# Patient Record
Sex: Female | Born: 1955 | Race: Black or African American | Hispanic: No | Smoking: Never smoker
Health system: Southern US, Community
[De-identification: ages and names within clinical notes are randomized; demographics above are authoritative.]

## PROBLEM LIST (undated history)

## (undated) DIAGNOSIS — Z803 Family history of malignant neoplasm of breast: Secondary | ICD-10-CM

## (undated) HISTORY — DX: Family history of malignant neoplasm of breast: Z80.3

---

## 2020-10-30 ENCOUNTER — Other Ambulatory Visit: Payer: Self-pay | Admitting: Surgery

## 2020-10-30 DIAGNOSIS — C50911 Malignant neoplasm of unspecified site of right female breast: Secondary | ICD-10-CM

## 2020-11-01 ENCOUNTER — Other Ambulatory Visit: Payer: Self-pay | Admitting: Surgery

## 2020-11-01 DIAGNOSIS — Z77018 Contact with and (suspected) exposure to other hazardous metals: Secondary | ICD-10-CM

## 2020-11-05 ENCOUNTER — Ambulatory Visit
Admission: RE | Admit: 2020-11-05 | Discharge: 2020-11-05 | Disposition: A | Discharge status: Home / Self Care | Payer: Medicare Other | Source: Ambulatory Visit | Attending: Surgery | Admitting: Surgery

## 2020-11-05 ENCOUNTER — Other Ambulatory Visit: Payer: Self-pay | Admitting: Surgery

## 2020-11-05 ENCOUNTER — Other Ambulatory Visit: Payer: Self-pay

## 2020-11-05 DIAGNOSIS — Z77018 Contact with and (suspected) exposure to other hazardous metals: Secondary | ICD-10-CM

## 2020-11-05 DIAGNOSIS — C50911 Malignant neoplasm of unspecified site of right female breast: Secondary | ICD-10-CM

## 2020-11-05 MED ORDER — GADOBUTROL 1 MMOL/ML IV SOLN
9.0000 mL | Freq: Once | INTRAVENOUS | Status: AC | PRN
  Administered 2020-11-05: 9 mL via INTRAVENOUS

## 2020-11-07 ENCOUNTER — Other Ambulatory Visit: Payer: Self-pay | Admitting: Surgery

## 2020-11-07 DIAGNOSIS — C50919 Malignant neoplasm of unspecified site of unspecified female breast: Secondary | ICD-10-CM

## 2020-11-19 ENCOUNTER — Ambulatory Visit
Admission: RE | Admit: 2020-11-19 | Discharge: 2020-11-19 | Disposition: A | Discharge status: Home / Self Care | Payer: Medicare Other | Source: Ambulatory Visit | Attending: Surgery | Admitting: Surgery

## 2020-11-19 ENCOUNTER — Other Ambulatory Visit: Payer: Self-pay

## 2020-11-19 DIAGNOSIS — C50919 Malignant neoplasm of unspecified site of unspecified female breast: Secondary | ICD-10-CM

## 2020-11-20 ENCOUNTER — Other Ambulatory Visit: Payer: Self-pay | Admitting: Surgery

## 2020-11-20 DIAGNOSIS — N63 Unspecified lump in unspecified breast: Secondary | ICD-10-CM

## 2020-11-22 ENCOUNTER — Other Ambulatory Visit: Payer: Self-pay

## 2020-11-22 ENCOUNTER — Ambulatory Visit
Admission: RE | Admit: 2020-11-22 | Discharge: 2020-11-22 | Disposition: A | Discharge status: Home / Self Care | Payer: Medicare Other | Source: Ambulatory Visit | Attending: Surgery | Admitting: Surgery

## 2020-11-22 DIAGNOSIS — N63 Unspecified lump in unspecified breast: Secondary | ICD-10-CM

## 2021-01-01 ENCOUNTER — Encounter (HOSPITAL_COMMUNITY): Payer: Self-pay

## 2021-01-03 ENCOUNTER — Inpatient Hospital Stay (HOSPITAL_COMMUNITY): Payer: Medicare Other | Admitting: Hematology

## 2021-01-03 DIAGNOSIS — C50511 Malignant neoplasm of lower-outer quadrant of right female breast: Secondary | ICD-10-CM | POA: Insufficient documentation

## 2021-01-07 ENCOUNTER — Inpatient Hospital Stay (HOSPITAL_COMMUNITY): Payer: Medicare Other | Attending: Hematology | Admitting: Hematology

## 2021-01-07 ENCOUNTER — Other Ambulatory Visit: Payer: Self-pay

## 2021-01-07 ENCOUNTER — Encounter (HOSPITAL_COMMUNITY): Payer: Self-pay | Admitting: Hematology

## 2021-01-07 ENCOUNTER — Inpatient Hospital Stay (HOSPITAL_COMMUNITY): Payer: Medicare Other

## 2021-01-07 VITALS — BP 133/93 | HR 96 | Temp 97.1°F | Resp 18 | Wt 185.9 lb

## 2021-01-07 DIAGNOSIS — C50411 Malignant neoplasm of upper-outer quadrant of right female breast: Secondary | ICD-10-CM

## 2021-01-07 DIAGNOSIS — Z170 Estrogen receptor positive status [ER+]: Secondary | ICD-10-CM

## 2021-01-07 DIAGNOSIS — Z803 Family history of malignant neoplasm of breast: Secondary | ICD-10-CM

## 2021-01-07 LAB — CBC WITH DIFFERENTIAL/PLATELET
Abs Immature Granulocytes: 0.02 10*3/uL (ref 0.00–0.07)
Basophils Absolute: 0 10*3/uL (ref 0.0–0.1)
Basophils Relative: 1 %
Eosinophils Absolute: 0.1 10*3/uL (ref 0.0–0.5)
Eosinophils Relative: 2 %
HCT: 36.7 % (ref 36.0–46.0)
Hemoglobin: 11.4 g/dL — ABNORMAL LOW (ref 12.0–15.0)
Immature Granulocytes: 0 %
Lymphocytes Relative: 30 %
Lymphs Abs: 2.6 10*3/uL (ref 0.7–4.0)
MCH: 29.8 pg (ref 26.0–34.0)
MCHC: 31.1 g/dL (ref 30.0–36.0)
MCV: 95.8 fL (ref 80.0–100.0)
Monocytes Absolute: 0.7 10*3/uL (ref 0.1–1.0)
Monocytes Relative: 8 %
Neutro Abs: 5.2 10*3/uL (ref 1.7–7.7)
Neutrophils Relative %: 59 %
Platelets: 286 10*3/uL (ref 150–400)
RBC: 3.83 MIL/uL — ABNORMAL LOW (ref 3.87–5.11)
RDW: 14.1 % (ref 11.5–15.5)
WBC: 8.6 10*3/uL (ref 4.0–10.5)
nRBC: 0 % (ref 0.0–0.2)

## 2021-01-07 LAB — COMPREHENSIVE METABOLIC PANEL
ALT: 13 U/L (ref 0–44)
AST: 21 U/L (ref 15–41)
Albumin: 3.8 g/dL (ref 3.5–5.0)
Alkaline Phosphatase: 109 U/L (ref 38–126)
Anion gap: 8 (ref 5–15)
BUN: 14 mg/dL (ref 8–23)
CO2: 26 mmol/L (ref 22–32)
Calcium: 9.1 mg/dL (ref 8.9–10.3)
Chloride: 100 mmol/L (ref 98–111)
Creatinine, Ser: 0.9 mg/dL (ref 0.44–1.00)
GFR, Estimated: >60 mL/min (ref >60)
Glucose, Bld: 80 mg/dL (ref 70–99)
Potassium: 3.8 mmol/L (ref 3.5–5.1)
Sodium: 134 mmol/L — ABNORMAL LOW (ref 135–145)
Total Bilirubin: 0.5 mg/dL (ref 0.3–1.2)
Total Protein: 7.4 g/dL (ref 6.5–8.1)

## 2021-01-07 LAB — VITAMIN D 25 HYDROXY (VIT D DEFICIENCY, FRACTURES): Vit D, 25-Hydroxy: 61.06 ng/mL (ref 30–100)

## 2021-01-07 MED ORDER — ANASTROZOLE 1 MG PO TABS
1.0000 mg | ORAL_TABLET | Freq: Every day | ORAL | 6 refills | Status: DC
Start: 2021-01-07 — End: 2021-08-07

## 2021-01-07 NOTE — Patient Instructions (Signed)
[place] at [facility] Discharge Instructions  You were seen and examined today by Dr. Delton Coombes. Dr. Delton Coombes is a medical oncologist, meaning he specializes in the management of cancer diagnoses with medications. Dr. Delton Coombes discussed your past medical history, family history of cancer and the events that led to you being here today.  You have been diagnosed with Stage I breast cancer. Your cancer was positive for Estrogen Receptors, Progesterone Receptors and HER-2. Your lymph nodes were negative. This is great news. Your cancer was extremely small at only 0.5 cm, but it is slightly more aggressive because it is HER2 positive. There is not strong data to show if you would benefit from chemotherapy due to the extremely small size of your breast cancer. It would be absolutely reasonable for you continue with close monitoring. Our office will arrange for a referral to Radiation Oncology and for a follow-up in 4 months. Because your cancer is estrogen positive, you will need an antiestrogen pill for 5 years. The most common side effects of antiestrogen medications are menopause-like symptoms. Please take calcium and vitamin d each day.  Dr. Delton Coombes has recommended genetic counseling to see if there is any genetic link to your cancer. This can be done with a simple blood draw and a meeting with the genetic counselor. Dr. Delton Coombes has also recommended  Follow-up as scheduled.  Thank you for choosing [place] at [facility] to provide your oncology and hematology care.  To afford each patient quality time with our provider, please arrive at least 15 minutes before your scheduled appointment time.   If you have a lab appointment with the [facility] please come in thru the Main Entrance and check in at the main information desk.  You need to re-schedule your appointment should you arrive 10 or more minutes late.  We strive to give you quality  time with our providers, and arriving late affects you and other patients whose appointments are after yours.  Also, if you no show three or more times for appointments you may be dismissed from the clinic at the providers discretion.     Again, thank you for choosing [facility].  Our hope is that these requests will decrease the amount of time that you wait before being seen by our physicians.       _____________________________________________________________  Should you have questions after your visit to [facility], please contact our office at [phone] and follow the prompts.  Our office hours are 8:00 a.m. and 4:30 p.m. Monday - Friday.  Please note that voicemails left after 4:00 p.m. may not be returned until the following business day.  We are closed weekends and major holidays.  You do have access to a nurse 24-7, just call the main number to the clinic [phone] and do not press any options, hold on the line and a nurse will answer the phone.    For prescription refill requests, have your pharmacy contact our office and allow 72 hours.    Due to Covid, you will need to wear a mask upon entering the hospital. If you do not have a mask, a mask will be given to you at the Main Entrance upon arrival. For doctor visits, patients may have 1 support person age 53 or older with them. For treatment visits, patients can not have anyone with them due to social distancing guidelines and our immunocompromised population.

## 2021-01-07 NOTE — Progress Notes (Signed)
[place] [address] [place] [address]   Patient Care Team: Pcp, No as PCP - General [name], RN as Oncology Nurse Navigator (Oncology)  CHIEF COMPLAINTS/PURPOSE OF CONSULTATION:  Newly diagnosed invasive ductal carcinoma of right breast  HISTORY OF PRESENTING ILLNESS:  Jill Wheeler 65 y.o. female is here because of recent diagnosis of invasive ductal carcinoma of right breast, at the request of Dr. Karlyn Agee from [facility].  Today she is accompanied by a family member, Jill Wheeler, and she reports feeling well. She denies having any previous breast biopsies. She never felt the lump and it was discovered on mammogram. Her breast is still sore after the biopsy but is healing. She denies having any leg swelling. She took vitamin D once a week and now is taking daily vitamin D.  Her daughter was diagnosed with breast cancer at age of 82; 2 of her brother's nieces were also diagnosed with breast cancer in their 93's; her MGM had breast cancer; her paternal cousin had breast cancer. 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 She smoked and quit many years ago. She lives at home alone and is able to do all of her chores and ADL's.  She prefers to not have to go through chemo if she can skip it.  I reviewed her records extensively and collaborated the history with the patient.  SUMMARY OF ONCOLOGIC HISTORY: Oncology History   No history exists.    In terms of breast cancer risk profile:  She menarched at early age of 22 and went to menopause at age mid 70's.  She had 3 pregnancies, her first child was born at age 83.  She has received birth control pills for approximately 10 years.  She was never exposed to fertility medications or hormone replacement therapy.  She has positive family history of Breast/GYN/GI cancer.  MEDICAL HISTORY:  No past medical history on file.  SURGICAL HISTORY: ** The histories are not reviewed yet.  Please review them in the "History" navigator section and refresh this [place].  SOCIAL HISTORY: Social History   Socioeconomic History  . Marital status: Divorced    Spouse name: Not on file  . Number of children: Not on file  . Years of education: Not on file  . Highest education level: Not on file  Occupational History  . Not on file  Tobacco Use  . Smoking status: Never Smoker  . Smokeless tobacco: Never Used  Substance and Sexual Activity  . Alcohol use: Not Currently  . Drug use: Never  . Sexual activity: Never  Other Topics Concern  . Not on file  Social History Narrative  . Not on file   Social Determinants of Health   Financial Resource Strain: Not on file  Food Insecurity: Not on file  Transportation Needs: Not on file  Physical Activity: Not on file  Stress: Not on file  Social Connections: Not on file  Intimate Partner Violence: Not At Risk  . Fear of Current or Ex-Partner: No  . Emotionally Abused: No  . Physically Abused: No  . Sexually Abused: No    FAMILY HISTORY: No family history on file.  ALLERGIES:  is allergic to lisinopril and penicillins.  MEDICATIONS:  Current Outpatient Medications  Medication Sig Dispense Refill  . anastrozole (ARIMIDEX) 1 MG tablet Take 1 tablet (1 mg total) by mouth daily. 30 tablet 6  . aspirin 81 MG EC tablet Take 81  mg by mouth daily.    Marland Kitchen atorvastatin (LIPITOR) 40 MG tablet 1 tab(s)    . cetirizine (ZYRTEC) 10 MG tablet 1 tab(s)    . losartan-hydrochlorothiazide (HYZAAR) 100-25 MG tablet SMARTSIG:0.5 Tablet(s) By Mouth 1 to 2 Times Daily    . Vitamin D, Ergocalciferol, (DRISDOL) 1.25 MG (50000 UNIT) CAPS capsule 1 capsule    . docusate sodium (COLACE) 100 MG capsule Take 100 mg by mouth 2 (two) times daily. (Patient not taking: Reported on 01/07/2021)    . HYDROcodone-acetaminophen (NORCO/VICODIN) 5-325 MG tablet Take 1 tablet by mouth every 4 (four) hours as needed. (Patient not taking: Reported on 01/07/2021)     . LORazepam (ATIVAN) 0.5 MG tablet 1/2 tab (Patient not taking: Reported on 01/07/2021)    . ondansetron (ZOFRAN) 4 MG tablet Take by mouth. (Patient not taking: Reported on 01/07/2021)    . pravastatin (PRAVACHOL) 40 MG tablet Take 1 tablet by mouth daily.    . traMADol (ULTRAM) 50 MG tablet Take 50 mg by mouth 4 (four) times daily as needed. (Patient not taking: Reported on 01/07/2021)     No current facility-administered medications for this visit.    REVIEW OF SYSTEMS:   Review of Systems  Constitutional: Positive for fatigue (50%). Negative for appetite change.  Cardiovascular: Negative for leg swelling.  Musculoskeletal: Positive for myalgias (overall soreness).  All other systems reviewed and are negative.   PHYSICAL EXAMINATION: ECOG PERFORMANCE STATUS: 0 - Asymptomatic  Vitals:   01/07/21 1321  BP: (!) 133/93  Pulse: 96  Resp: 18  Temp: (!) 97.1 F (36.2 C)  SpO2: 98%   Filed Weights   01/07/21 1321  Weight: 185 lb 14.4 oz (84.3 kg)   Physical Exam Vitals reviewed.  Constitutional:      Appearance: Normal appearance.  Cardiovascular:     Rate and Rhythm: Normal rate and regular rhythm.     Pulses: Normal pulses.     Heart sounds: Normal heart sounds.  Pulmonary:     Effort: Pulmonary effort is normal.     Breath sounds: Normal breath sounds.  Chest:  Breasts:     Right: Skin change (lumpectomy scar healing; hematoma in outer upper & lower quadrant) present. No inverted nipple or nipple discharge.     Left: No swelling, bleeding, inverted nipple, mass, nipple discharge, skin change or tenderness.    Abdominal:     Palpations: Abdomen is soft. There is no mass.     Tenderness: There is no abdominal tenderness.     Hernia: No hernia is present.  Musculoskeletal:     Right lower leg: No edema.     Left lower leg: No edema.  Neurological:     General: No focal deficit present.     Mental Status: She is alert and oriented to person, place, and time.   Psychiatric:        Mood and Affect: Mood normal.        Behavior: Behavior normal.      LABORATORY DATA:  I have reviewed the data as listed No results found for this or any previous visit (from the past 2160 hour(s)).  RADIOGRAPHIC STUDIES: I have personally reviewed the radiological reports and agreed with the findings in the report. No results found.   ASSESSMENT:  1.  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 -Right breast ultrasound on 10/03/2020-0.4 cm mass at right breast 8 o'clock position -Biopsy of the  mass on 10/17/2020. -MRI of the breast on 11/05/2020 with 1 cm enhancing mass in the right breast LOQ -Right breast upper outer quadrant mass biopsy on 11/19/2020 consistent with DCIS. -Right lumpectomy and SLNB on 12/12/2020 by Dr. Ladona Horns in [place]. -Pathology consistent with grade two 0.5 cm invasive ductal carcinoma, 0.8 cm to the closest anterior resection margin.  DCIS, high-grade with comedonecrosis, at least 3.2 cm, lesion comes to less than 0.1 cm of closest posterior resection margin.  0/2 sentinel lymph nodes positive for metastatic carcinoma.  ER-60%, PR-90%, Ki-67-10%, HER-2 positive by FISH.  PT1aPN0.  2.  Social/family history: -She lives at home by herself.  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 -Daughter had breast cancer at age 12.  6 of her nieces had breast cancer in their 32s.  Maternal grandmother had breast cancer.  Paternal cousin had breast cancer.    PLAN:  1.  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 -It is not entirely clear whether HER-2 directed treatment is indicated for patients with node-negative small HER-2 positive tumors.  There are no data to support use of trastuzumab with endocrine therapy without prior use of chemotherapy. -We had prolonged discussion about not having any good data regarding her  tumor size of 0.5 cm. -Upon further discussion, she would like to avoid chemotherapy. -I think it is very reasonable to forego chemotherapy and start her on antiestrogen therapy with anastrozole 1 mg daily for 5 years. -I have also recommended baseline bone density test.  We will also obtain a vitamin D level. -We will make a referral to radiation oncology. -We will see her back in 3 months for follow-up to check on tolerability to anastrozole.  2.  Family history: -Because of extensive amnestic, I have strongly recommended genetic testing.    All questions were answered. The patient knows to call the clinic with any problems, questions or concerns.   [name], MD 01/07/21 2:27 PM  [place] [phone]   I, Milinda Antis, am acting as a scribe for Dr. Sanda Linger.  I, [name] MD, have reviewed the above documentation for accuracy and completeness, and I agree with the above.

## 2021-01-09 ENCOUNTER — Encounter (HOSPITAL_COMMUNITY): Payer: Self-pay | Admitting: Lab

## 2021-01-09 NOTE — Progress Notes (Unsigned)
Referral sent to [facility] . Records faxes on 3/29

## 2021-01-14 ENCOUNTER — Ambulatory Visit (HOSPITAL_COMMUNITY): Payer: Medicare Other | Admitting: Hematology

## 2021-01-24 ENCOUNTER — Other Ambulatory Visit: Payer: Self-pay

## 2021-01-24 ENCOUNTER — Inpatient Hospital Stay (HOSPITAL_COMMUNITY): Payer: Medicare Other | Attending: Hematology | Admitting: Genetic Counselor

## 2021-01-24 ENCOUNTER — Ambulatory Visit (HOSPITAL_COMMUNITY)
Admission: RE | Admit: 2021-01-24 | Discharge: 2021-01-24 | Disposition: A | Discharge status: Home / Self Care | Payer: Medicare Other | Source: Ambulatory Visit | Attending: Hematology | Admitting: Hematology

## 2021-01-24 DIAGNOSIS — Z780 Asymptomatic menopausal state: Secondary | ICD-10-CM | POA: Insufficient documentation

## 2021-01-24 DIAGNOSIS — Z803 Family history of malignant neoplasm of breast: Secondary | ICD-10-CM | POA: Diagnosis not present

## 2021-01-24 DIAGNOSIS — C50511 Malignant neoplasm of lower-outer quadrant of right female breast: Secondary | ICD-10-CM

## 2021-01-24 DIAGNOSIS — Z13820 Encounter for screening for osteoporosis: Secondary | ICD-10-CM | POA: Insufficient documentation

## 2021-01-24 DIAGNOSIS — Z170 Estrogen receptor positive status [ER+]: Secondary | ICD-10-CM

## 2021-01-24 DIAGNOSIS — C50411 Malignant neoplasm of upper-outer quadrant of right female breast: Secondary | ICD-10-CM | POA: Diagnosis not present

## 2021-01-28 ENCOUNTER — Encounter (HOSPITAL_COMMUNITY): Payer: Self-pay | Admitting: Genetic Counselor

## 2021-01-28 DIAGNOSIS — Z803 Family history of malignant neoplasm of breast: Secondary | ICD-10-CM | POA: Insufficient documentation

## 2021-01-28 NOTE — Progress Notes (Signed)
REFERRING PROVIDER: [name], MD [address] [place],  [place] [address]  PRIMARY PROVIDER:  Pcp, No  PRIMARY REASON FOR VISIT:  1. Malignant neoplasm of lower-outer quadrant of right breast of female, estrogen receptor positive ([facility])   2. Family history of breast cancer      I connected with Ms. Siegrist on 01/24/2021 at 2:00 pm EDT by video conference and verified that I am speaking with the correct person using two identifiers.   Patient location: [place] Provider location: [place]  HISTORY OF PRESENT ILLNESS:   Ms. Leas, a 65 y.o. female, was seen for a [facility] cancer genetics consultation at the request of Dr. Delton Coombes due to a personal and family history of breast cancer.  Ms. Yellin presents to clinic today to discuss the possibility of a hereditary predisposition to cancer, genetic testing, and to further clarify her future cancer risks, as well as potential cancer risks for family members.   In January of 2022, at the age of 36, Ms. Cloe was diagnosed with invasive ductal carcinoma of the right breast. The tumor was ER+/PR+/Her2+. The treatment plan includes lumpectomy (completed 12/12/2020), radiation therapy, and antiestrogen therapy.   CANCER HISTORY:  Oncology History  Breast cancer of lower-outer quadrant of right female breast ([place])  01/03/2021 Initial Diagnosis   Breast cancer of lower-outer quadrant of right female breast ([place])   01/07/2021 Cancer Staging   Staging form: Breast, AJCC 8th Edition - Clinical stage from 01/07/2021: Stage IA (cT1a, cN0, cM0, G2, ER+, PR+, HER2+) - Signed by [name], MD on 01/07/2021 Stage prefix: Initial diagnosis Nuclear grade: G2 Histologic grading system: 3 grade system Ki-67 (%): 10     RISK FACTORS:  Menarche was at age 14.  First live birth at age 67.  OCP use for approximately 10 years.  Ovaries intact: yes.  Hysterectomy: no.  Menopausal status:  postmenopausal.  HRT use: 0 years. Colonoscopy: yes; normal. Mammogram within the last year: yes. Any excessive radiation exposure in the past: no.   Past Medical History:  Diagnosis Date  . Family history of breast cancer     Social History   Socioeconomic History  . Marital status: Divorced    Spouse name: Not on file  . Number of children: Not on file  . Years of education: Not on file  . Highest education level: Not on file  Occupational History  . Not on file  Tobacco Use  . Smoking status: Never Smoker  . Smokeless tobacco: Never Used  Substance and Sexual Activity  . Alcohol use: Not Currently  . Drug use: Never  . Sexual activity: Never  Other Topics Concern  . Not on file  Social History Narrative  . Not on file   Social Determinants of Health   Financial Resource Strain: Not on file  Food Insecurity: Not on file  Transportation Needs: Not on file  Physical Activity: Not on file  Stress: Not on file  Social Connections: Not on file     FAMILY HISTORY:  We obtained a detailed, 4-generation family history.  Significant diagnoses are listed below: Family History  Problem Relation Age of Onset  . Heart attack Maternal Uncle   . Breast cancer Maternal Grandmother        dx 24s  . Breast cancer Other        dx 75s (mother's first cousin)  . Heart attack Maternal Uncle   . Breast cancer Niece  dx 23s  . Breast cancer Niece        dx 41s - had genetic test but result unknown  . Breast cancer Daughter 11       negative genetic testing  . Breast cancer Cousin 46       paternal first cousin   Ms. Moes has one daughter (age 37) and had two sons (one age 57, the other deceased at age 7). Her daughter was diagnosed with breast cancer at age 42 and had negative genetic testing (result not available for review). She has two brothers (ages 52 and 65) and one sister (age 3). Two nieces had breast cancer diagnosed in their 75s. One may have had genetic  testing although Ms. Pescador is unsure of these results.  Ms. Touch mother died at age 71 without cancer. There were two maternal uncles. There is no known cancer among maternal aunts/uncles or maternal first cousins. Ms. Rohlman maternal grandmother died in her late 60s and had a history of breast cancer in her 80s. Her maternal grandfather died in his 62s or 57s without cancer. A maternal first cousin once removed (MGM's niece) had breast cancer in her 40s.  Ms. Yorio father died in his 10s without cancer. There was one paternal aunt, who did not have cancer. One paternal first cousin had breast cancer diagnosed at age 81. Ms. Balicki paternal grandmother died in her 60s without cancer. Her paternal grandfather died in his late 21s without cancer.  Ms. Lai is aware of previous family history of genetic testing for hereditary cancer risks. In addition to [employer], Ms. Lasseigne maternal ancestors are of White/Caucasian descent, and paternal ancestors are of Native American descent. There is no reported Ashkenazi Jewish ancestry. There is no known consanguinity.  GENETIC COUNSELING ASSESSMENT: Ms. Steinhart is a 65 y.o. female with a personal and family history of breast cancer which is somewhat suggestive of a hereditary cancer syndrome and predisposition to cancer. We, therefore, discussed and recommended the following at today's visit.   DISCUSSION: We discussed that approximately 5-10% of breast cancer is hereditary, with most cases associated with the BRCA1 and BRCA2 genes. There are other genes that can be associated with hereditary breast cancer syndromes. These include ATM, CHEK2, PALB2, etc. We discussed that testing is beneficial for several reasons, including knowing about other cancer risks, identifying potential screening and risk-reduction options that may be appropriate, and to understand if other family members could be at risk for cancer and allow them to undergo  genetic testing.   We reviewed the characteristics, features and inheritance patterns of hereditary cancer syndromes. We also discussed genetic testing, including the appropriate family members to test, the process of testing, insurance coverage and turn-around-time for results. We discussed the implications of a negative, positive and/or variant of uncertain significant result. We recommended Ms. Lana pursue genetic testing for the [facility] Common Hereditary Cancers + RNA panel.   The Common Hereditary Cancers Panel offered by [facility] includes sequencing and/or deletion duplication testing of the following 47genes: APC*, ATM*, AXIN2*, BARD1*, BMPR1A*, BRCA1*, BRCA2*, BRIP1*, CDH1*, CDK4, CDKN2A (p14ARF), CDKN2A (p16INK4a), CHEK2*, CTNNA1*, DICER1*, EPCAM (Deletion/duplication testing only), GREM1 (promoter region deletion/duplication testing only), KIT, MEN1*, MLH1*, MSH2*, MSH3*, MSH6*, MUTYH*, NBN*, NF1*, NTHL1, PALB2*, PDGFRA, PMS2*, POLD1*, POLE*, PTEN*, RAD50*, RAD51C*, RAD51D*, SDHB*, SDHC*, SDHD*, SMAD4*, SMARCA4*, STK11*, TP53*, TSC1*, TSC2*, and VHL*.  The following genes were evaluated for sequence changes only: SDHA* and HOXB13 c.251G>A variant only. RNA analysis performed for * genes.  Based on Ms. Golay's personal and family history of cancer, she meets medical criteria for genetic testing. Despite that she meets criteria, there may still be an out of pocket cost. We discussed that if her out of pocket cost for testing is over $100, the laboratory will reach out to let her know. If the out of pocket cost of testing is less than $100 she will be billed by the genetic testing laboratory.   PLAN: After considering the risks, benefits, and limitations, Ms. Hobbins provided informed consent to pursue genetic testing and the blood sample was sent to [facility] for analysis of the Common Hereditary Cancers + RNA panel. Results should be available within approximately two-three weeks'  time, at which point they will be disclosed by telephone to Ms. Hardacre, as will any additional recommendations warranted by these results. Ms. Ostrovsky will receive a summary of her genetic counseling visit and a copy of her results once available. This information will also be available in Epic.   Ms. Manrique questions were answered to her satisfaction today. Our contact information was provided should additional questions or concerns arise. Thank you for the referral and allowing [place] to share in the care of your patient.   Clint Guy, [place], [facility] Licensed, Certified Dispensing [email or web address] Phone: [phone]  The patient was seen for a total of 35 minutes in face-to-face genetic counseling.  This patient was discussed with Drs. Magrinat, Lindi Adie and/or Burr Medico who agrees with the above.    _______________________________________________________________________ For Office Staff:  Number of people involved in session: 1 Was an Intern/ student involved with case: no

## 2021-02-14 DIAGNOSIS — Z1379 Encounter for other screening for genetic and chromosomal anomalies: Secondary | ICD-10-CM | POA: Insufficient documentation

## 2021-02-15 ENCOUNTER — Ambulatory Visit: Payer: Self-pay | Admitting: Genetic Counselor

## 2021-02-15 ENCOUNTER — Telehealth: Payer: Self-pay | Admitting: Genetic Counselor

## 2021-02-15 DIAGNOSIS — Z1379 Encounter for other screening for genetic and chromosomal anomalies: Secondary | ICD-10-CM

## 2021-02-15 NOTE — Telephone Encounter (Signed)
Revealed negative genetic testing. Discussed that we do not know why she has breast cancer or why there is cancer in the family. There could be a genetic mutation in the family that Ms. Alaniz did not inherit. There could also be a mutation in a different gene that we are not testing, or our current technology may not be able to detect certain mutations. It will therefore be important for her to stay in contact with genetics to keep up with whether additional testing may be appropriate in the future.   A variant of uncertain significance was detected in the CTNNA1 gene called c.795C>G (p.Asp265Glu). Her result is still considered normal at this time and should not impact her medical management.

## 2021-02-15 NOTE — Progress Notes (Signed)
HPI:  Jill Wheeler was previously seen in the [facility] clinic due to a personal and family history of breast cancer and concerns regarding a hereditary predisposition to cancer. Please refer to our prior cancer genetics clinic note for more information regarding our discussion, assessment and recommendations, at the time. Jill Wheeler recent genetic test results were disclosed to her, as were recommendations warranted by these results. These results and recommendations are discussed in more detail below.  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  Significant diagnoses are listed below: Family History  Problem Relation Age of Onset  . Heart attack Maternal Uncle   . Breast cancer Maternal Grandmother        dx 84s  . Breast cancer Other        dx 59s (mother's first cousin)  . Heart attack Maternal Uncle   . Breast cancer Niece        dx 32s  . Breast cancer Niece        dx 28s - had genetic test but result unknown  . Breast cancer Daughter 17       negative genetic testing  . Breast cancer Cousin 40       paternal first cousin   Jill Wheeler has one daughter (age 76) and had two sons (one age 58, the other deceased at age 53). Her daughter was diagnosed with breast cancer at age 79 and had negative genetic testing (result not available for review). She has two brothers (ages 50 and 39) and one sister (age 5). Two nieces had breast  cancer diagnosed in their 56s. One may have had genetic testing although Jill Wheeler is unsure of these results.  Jill Wheeler mother died at age 38 without cancer. There were two maternal uncles. There is no known cancer among maternal aunts/uncles or maternal first cousins. Jill Wheeler maternal grandmother died in her late 21s and had a history of breast cancer in her 46s. Her maternal grandfather died in his 80s or 33s without cancer. A maternal first cousin once removed (MGM's niece) had breast cancer in her 46s.  Jill Wheeler father died in his 61s without cancer. There was one paternal aunt, who did not have cancer. One paternal first cousin had breast cancer diagnosed at age 26. Jill Wheeler paternal grandmother died in her 70s without cancer. Her paternal grandfather died in his late 32s without cancer.  Jill Wheeler is aware of previous family history of genetic testing for hereditary cancer risks. In addition to [employer], Jill Wheeler maternal ancestors are of White/Caucasian descent, and paternal ancestors are of Native American descent. There is no reported Ashkenazi Jewish ancestry. There is no known consanguinity.  GENETIC TEST RESULTS: Genetic testing reported out on 02/14/2021 through the [facility] Common Hereditary Cancers + RNAinsight panel. No pathogenic variants were detected.   The Common Hereditary Cancers Panel offered by [facility] includes sequencing and/or deletion duplication testing of the following 47genes: APC*, ATM*, AXIN2*,  BARD1*, BMPR1A*, BRCA1*, BRCA2*, BRIP1*, CDH1*, CDK4, CDKN2A (p14ARF), CDKN2A (p16INK4a), CHEK2*, CTNNA1*, DICER1*, EPCAM (Deletion/duplication testing only), GREM1 (promoter region deletion/duplication testing only), KIT, MEN1*, MLH1*, MSH2*, MSH3*, MSH6*, MUTYH*, NBN*, NF1*, NTHL1, PALB2*, PDGFRA, PMS2*, POLD1*, POLE*, PTEN*, RAD50*, RAD51C*, RAD51D*, SDHB*, SDHC*, SDHD*, SMAD4*, SMARCA4*, STK11*, TP53*, TSC1*, TSC2*, and VHL*.  The  following genes were evaluated for sequence changes only: SDHA* and HOXB13 c.251G>A variant only. RNA analysis performed for * genes. The test report will be scanned into EPIC and located under the Molecular Pathology section of the Results Review tab.  A portion of the result report is included below for reference.     We discussed with Jill Wheeler that because current genetic testing is not perfect, it is possible there may be a gene mutation in one of these genes that current testing cannot detect, but that chance is small.  We also discussed that there could be another gene that has not yet been discovered, or that we have not yet tested, that is responsible for the cancer diagnoses in the family. It is also possible there is a hereditary cause for the cancer in the family that Jill Wheeler did not inherit and therefore was not identified in her testing.  Therefore, it is important to remain in touch with cancer genetics in the future so that we can continue to offer Jill Wheeler the most up to date genetic testing.   Genetic testing did identify a variant of uncertain significance (VUS) in the CTNNA1 gene called c.795C>G (p.Asp265Glu). At this time, it is unknown if this variant is associated with increased cancer risk or if this is a normal finding, but most variants such as this get reclassified to being inconsequential. It should not be used to make medical management decisions. With time, we suspect the lab will determine the significance of this variant, if any. If we do learn more about it, we will try to contact Jill Wheeler to discuss it further. However, it is important to stay in touch with [place] periodically and keep the address and phone number up to date.  CANCER SCREENING RECOMMENDATIONS: Jill Wheeler test result is considered negative (normal).  This means that we have not identified a hereditary cause for her personal and family history of cancer at this time. While reassuring, this does not  definitively rule out a hereditary predisposition to cancer. It is still possible that there could be genetic mutations that are undetectable by current technology. There could be genetic mutations in genes that have not been tested or identified to increase cancer risk.  Therefore, it is recommended she continue to follow the cancer management and screening guidelines provided by her oncology and primary healthcare provider.   An individual's cancer risk and medical management are not determined by genetic test results alone. Overall cancer risk assessment incorporates additional factors, including personal medical history, family history, and any available genetic information that may result in a personalized plan for cancer prevention and surveillance.  RECOMMENDATIONS FOR FAMILY MEMBERS:  Individuals in this family might be at some increased risk of developing cancer, over the general population risk, simply due to the family history of cancer.  We recommended women in this family have a yearly mammogram beginning at age 81, or 72 years younger than the earliest onset of cancer, an annual clinical breast exam, and perform monthly breast self-exams. Women in this family should also have a gynecological exam as recommended by their primary provider. All family members should be referred  for colonoscopy starting at age 62.  FOLLOW-UP: Lastly, we discussed with Jill Wheeler that cancer genetics is a rapidly advancing field and it is possible that new genetic tests will be appropriate for her and/or her family members in the future. We encouraged her to remain in contact with cancer genetics on an annual basis so we can update her personal and family histories and let her know of advances in cancer genetics that may benefit this family.   Our contact number was provided. Jill Wheeler. [place] questions were answered to her satisfaction, and she knows she is welcome to call us at anytime with additional questions or  concerns.   Clint Guy, Jill Wheeler, [facility] Genetic Counselor [email or web address] Phone: [phone]

## 2021-04-09 ENCOUNTER — Other Ambulatory Visit: Payer: Self-pay

## 2021-04-09 ENCOUNTER — Inpatient Hospital Stay (HOSPITAL_COMMUNITY): Payer: Medicare Other | Attending: Hematology

## 2021-04-09 ENCOUNTER — Inpatient Hospital Stay (HOSPITAL_BASED_OUTPATIENT_CLINIC_OR_DEPARTMENT_OTHER): Payer: Medicare Other | Admitting: Hematology and Oncology

## 2021-04-09 VITALS — BP 147/78 | HR 84 | Temp 97.1°F | Resp 18 | Wt 185.2 lb

## 2021-04-09 DIAGNOSIS — Z7982 Long term (current) use of aspirin: Secondary | ICD-10-CM | POA: Insufficient documentation

## 2021-04-09 DIAGNOSIS — Z803 Family history of malignant neoplasm of breast: Secondary | ICD-10-CM | POA: Diagnosis not present

## 2021-04-09 DIAGNOSIS — Z79811 Long term (current) use of aromatase inhibitors: Secondary | ICD-10-CM | POA: Insufficient documentation

## 2021-04-09 DIAGNOSIS — Z923 Personal history of irradiation: Secondary | ICD-10-CM | POA: Diagnosis not present

## 2021-04-09 DIAGNOSIS — C50511 Malignant neoplasm of lower-outer quadrant of right female breast: Secondary | ICD-10-CM

## 2021-04-09 DIAGNOSIS — Z79899 Other long term (current) drug therapy: Secondary | ICD-10-CM | POA: Diagnosis not present

## 2021-04-09 DIAGNOSIS — Z170 Estrogen receptor positive status [ER+]: Secondary | ICD-10-CM | POA: Insufficient documentation

## 2021-04-09 LAB — COMPREHENSIVE METABOLIC PANEL
ALT: 16 U/L (ref 0–44)
AST: 22 U/L (ref 15–41)
Albumin: 3.8 g/dL (ref 3.5–5.0)
Alkaline Phosphatase: 126 U/L (ref 38–126)
Anion gap: 9 (ref 5–15)
BUN: 12 mg/dL (ref 8–23)
CO2: 27 mmol/L (ref 22–32)
Calcium: 9.3 mg/dL (ref 8.9–10.3)
Chloride: 104 mmol/L (ref 98–111)
Creatinine, Ser: 0.87 mg/dL (ref 0.44–1.00)
GFR, Estimated: >60 mL/min (ref >60)
Glucose, Bld: 88 mg/dL (ref 70–99)
Potassium: 4 mmol/L (ref 3.5–5.1)
Sodium: 140 mmol/L (ref 135–145)
Total Bilirubin: 0.5 mg/dL (ref 0.3–1.2)
Total Protein: 7.2 g/dL (ref 6.5–8.1)

## 2021-04-09 LAB — CBC WITH DIFFERENTIAL/PLATELET
Abs Immature Granulocytes: 0.01 10*3/uL (ref 0.00–0.07)
Basophils Absolute: 0 10*3/uL (ref 0.0–0.1)
Basophils Relative: 1 %
Eosinophils Absolute: 0.2 10*3/uL (ref 0.0–0.5)
Eosinophils Relative: 2 %
HCT: 36.5 % (ref 36.0–46.0)
Hemoglobin: 11.5 g/dL — ABNORMAL LOW (ref 12.0–15.0)
Immature Granulocytes: 0 %
Lymphocytes Relative: 19 %
Lymphs Abs: 1.6 10*3/uL (ref 0.7–4.0)
MCH: 29.9 pg (ref 26.0–34.0)
MCHC: 31.5 g/dL (ref 30.0–36.0)
MCV: 95.1 fL (ref 80.0–100.0)
Monocytes Absolute: 0.7 10*3/uL (ref 0.1–1.0)
Monocytes Relative: 8 %
Neutro Abs: 6.2 10*3/uL (ref 1.7–7.7)
Neutrophils Relative %: 70 %
Platelets: 236 10*3/uL (ref 150–400)
RBC: 3.84 MIL/uL — ABNORMAL LOW (ref 3.87–5.11)
RDW: 13.3 % (ref 11.5–15.5)
WBC: 8.8 10*3/uL (ref 4.0–10.5)
nRBC: 0 % (ref 0.0–0.2)

## 2021-04-09 NOTE — Progress Notes (Signed)
Patient is taking anastrozole as prescribed and denies any adverse side effects.

## 2021-04-09 NOTE — Progress Notes (Signed)
[facility] [address], [place] [address]   Patient Care Team: Pcp, No as PCP - General [name], RN as Oncology Nurse Navigator (Oncology)  Oncological History:  Invasive ductal carcinoma of right breast  HISTORY OF PRESENTING ILLNESS:  Jill Wheeler 65 y.o. female is here for follow up of of invasive ductal carcinoma of right breast. She was last seen on 01/07/2021.  On exam today Jill Wheeler reports in interim since her last visit she completed radiation therapy on 03/11/2021 at [facility].  She notes that she is taking the anastrozole pills and has not been having any difficulty with them.  She reports that she was having some hot flashes and sweats before she even began taking them.  She notes that they have been "fine".  She notes that she is also not having any joint pain and has no questions concerns or complaints.  She denies any fevers, chills, sweats, nausea, vomiting or diarrhea.  A full 10 point ROS is listed below.  SUMMARY OF ONCOLOGIC HISTORY: Oncology History  Breast cancer of lower-outer quadrant of right female breast ([place])  01/03/2021 Initial Diagnosis   Breast cancer of lower-outer quadrant of right female breast ([place])   01/07/2021 Cancer Staging   Staging form: Breast, AJCC 8th Edition - Clinical stage from 01/07/2021: Stage IA (cT1a, cN0, cM0, G2, ER+, PR+, HER2+) - Signed by [name], MD on 01/07/2021 Stage prefix: Initial diagnosis Nuclear grade: G2 Histologic grading system: 3 grade system Ki-67 (%): 10    MEDICAL HISTORY:  Past Medical History:  Diagnosis Date   Family history of breast cancer    SOCIAL HISTORY: Social History   Socioeconomic History   Marital status: Divorced    Spouse name: Not on file   Number of children: Not on file   Years of education: Not on file   Highest education level: Not on file  Occupational History   Not on file  Tobacco Use   Smoking status: Never   Smokeless  tobacco: Never  Substance and Sexual Activity   Alcohol use: Not Currently   Drug use: Never   Sexual activity: Never  Other Topics Concern   Not on file  Social History Narrative   Not on file   Social Determinants of Health   Financial Resource Strain: Not on file  Food Insecurity: Not on file  Transportation Needs: Not on file  Physical Activity: Not on file  Stress: Not on file  Social Connections: Not on file  Intimate Partner Violence: Not At Risk   Fear of Current or Ex-Partner: No   Emotionally Abused: No   Physically Abused: No   Sexually Abused: No    FAMILY HISTORY: Family History  Problem Relation Age of Onset   Heart attack Maternal Uncle    Breast cancer Maternal Grandmother        dx 59s   Breast cancer Other        dx 110s (mother's first cousin)   Heart attack Maternal Uncle    Breast cancer Niece        dx 49s   Breast cancer Niece        dx 27s - had genetic test but result unknown   Breast cancer Daughter 13       negative genetic testing   Breast cancer Cousin 17       paternal first cousin    ALLERGIES:  is allergic to lisinopril and penicillins.  MEDICATIONS:  Current Outpatient Medications  Medication Sig Dispense Refill   anastrozole (ARIMIDEX) 1 MG tablet Take 1 tablet (1 mg total) by mouth daily. 30 tablet 6   aspirin 81 MG EC tablet Take 81 mg by mouth daily.     atorvastatin (LIPITOR) 40 MG tablet 1 tab(s)     cetirizine (ZYRTEC) 10 MG tablet 1 tab(s)     docusate sodium (COLACE) 100 MG capsule Take 100 mg by mouth 2 (two) times daily.     HYDROcodone-acetaminophen (NORCO/VICODIN) 5-325 MG tablet Take 1 tablet by mouth every 4 (four) hours as needed.     LORazepam (ATIVAN) 0.5 MG tablet      losartan-hydrochlorothiazide (HYZAAR) 100-25 MG tablet SMARTSIG:0.5 Tablet(s) By Mouth 1 to 2 Times Daily     pravastatin (PRAVACHOL) 40 MG tablet Take 1 tablet by mouth daily.     traMADol (ULTRAM) 50 MG tablet Take 50 mg by mouth 4 (four)  times daily as needed.     Vitamin D, Ergocalciferol, (DRISDOL) 1.25 MG (50000 UNIT) CAPS capsule 1 capsule     ondansetron (ZOFRAN) 4 MG tablet Take by mouth. (Patient not taking: Reported on 04/09/2021)     No current facility-administered medications for this visit.    REVIEW OF SYSTEMS:   Review of Systems  Constitutional:  Positive for fatigue (50%). Negative for appetite change.  Cardiovascular:  Negative for leg swelling.  Musculoskeletal:  Positive for myalgias (overall soreness).  All other systems reviewed and are negative.  PHYSICAL EXAMINATION: ECOG PERFORMANCE STATUS: 0 - Asymptomatic  Vitals:   04/09/21 1400  BP: (!) 147/78  Pulse: 84  Resp: 18  Temp: (!) 97.1 F (36.2 C)  SpO2: 100%   Filed Weights   04/09/21 1400  Weight: 185 lb 3.2 oz (84 kg)   Physical Exam Vitals reviewed.  Constitutional:      Appearance: Normal appearance.  Cardiovascular:     Rate and Rhythm: Normal rate and regular rhythm.     Pulses: Normal pulses.     Heart sounds: Normal heart sounds.  Pulmonary:     Effort: Pulmonary effort is normal.     Breath sounds: Normal breath sounds.  Chest:  Breasts:    Right: Skin change (lumpectomy scar healing; hematoma in outer upper & lower quadrant) present. No inverted nipple or nipple discharge.     Left: No swelling, bleeding, inverted nipple, mass, nipple discharge, skin change or tenderness.  Abdominal:     Palpations: Abdomen is soft. There is no mass.     Tenderness: There is no abdominal tenderness.     Hernia: No hernia is present.  Musculoskeletal:     Right lower leg: No edema.     Left lower leg: No edema.  Neurological:     General: No focal deficit present.     Mental Status: She is alert and oriented to person, place, and time.  Psychiatric:        Mood and Affect: Mood normal.        Behavior: Behavior normal.     LABORATORY DATA:  I have reviewed the data as listed Recent Results (from the past 2160 hour(s))  CBC  with Differential     Status: Abnormal   Collection Time: 04/09/21  1:48 PM  Result Value Ref Range   WBC 8.8 4.0 - 10.5 K/uL   RBC 3.84 (L) 3.87 - 5.11 MIL/uL   Hemoglobin 11.5 (L) 12.0 - 15.0 g/dL   HCT 36.5 36.0 - 46.0 %  MCV 95.1 80.0 - 100.0 fL   MCH 29.9 26.0 - 34.0 pg   MCHC 31.5 30.0 - 36.0 g/dL   RDW 13.3 11.5 - 15.5 %   Platelets 236 150 - 400 K/uL   nRBC 0.0 0.0 - 0.2 %   Neutrophils Relative % 70 %   Neutro Abs 6.2 1.7 - 7.7 K/uL   Lymphocytes Relative 19 %   Lymphs Abs 1.6 0.7 - 4.0 K/uL   Monocytes Relative 8 %   Monocytes Absolute 0.7 0.1 - 1.0 K/uL   Eosinophils Relative 2 %   Eosinophils Absolute 0.2 0.0 - 0.5 K/uL   Basophils Relative 1 %   Basophils Absolute 0.0 0.0 - 0.1 K/uL   Immature Granulocytes 0 %   Abs Immature Granulocytes 0.01 0.00 - 0.07 K/uL    Comment: Performed at [facility], [address], [place], [place] [address]    RADIOGRAPHIC STUDIES: No results found.   ASSESSMENT:  1.  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 -Right breast ultrasound on 10/03/2020-0.4 cm mass at right breast 8 o'clock position -Biopsy of the mass on 10/17/2020. -MRI of the breast on 11/05/2020 with 1 cm enhancing mass in the right breast LOQ -Right breast upper outer quadrant mass biopsy on 11/19/2020 consistent with DCIS. -Right lumpectomy and SLNB on 12/12/2020 by Dr. Ladona Horns in [place]. -Pathology consistent with grade two 0.5 cm invasive ductal carcinoma, 0.8 cm to the closest anterior resection margin.  DCIS, high-grade with comedonecrosis, at least 3.2 cm, lesion comes to less than 0.1 cm of closest posterior resection margin.  0/2 sentinel lymph nodes positive for metastatic carcinoma.  ER-60%, PR-90%, Ki-67-10%, HER-2 positive by FISH.  PT1aPN0.  2.  Social/family history: -She lives at home by herself.  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 -Daughter had breast cancer at age 72.  43 of her  nieces had breast cancer in their 93s.  Maternal grandmother had breast cancer.  Paternal cousin had breast cancer.    PLAN:  1.  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 -It is not entirely clear whether HER-2 directed treatment is indicated for patients with node-negative small HER-2 positive tumors.  There are no data to support use of trastuzumab with endocrine therapy without prior use of chemotherapy. -We had prolonged discussion about not having any good data regarding her tumor size of 0.5 cm. -Upon further discussion, she would like to avoid chemotherapy. -at last visit she was started on antiestrogen therapy with anastrozole 1 mg daily for 5 years. -bone density test completed 01/24/2021.  --completed radiation on 03/11/2021 [facility]  -We will see her back in 3 months for follow-up to check on her continued tolerability to anastrozole.  2.  Family history: -Because of extensive amnestic, I have strongly recommended genetic testing.  All questions were answered. The patient knows to call the clinic with any problems, questions or concerns.   Ledell Peoples, MD Department of Hematology/Oncology [place] at [facility] Phone: [phone] Pager: [phone] Email: [email or web address]

## 2021-04-12 ENCOUNTER — Other Ambulatory Visit (HOSPITAL_COMMUNITY): Payer: Self-pay

## 2021-04-12 DIAGNOSIS — C50411 Malignant neoplasm of upper-outer quadrant of right female breast: Secondary | ICD-10-CM

## 2021-04-12 DIAGNOSIS — Z170 Estrogen receptor positive status [ER+]: Secondary | ICD-10-CM

## 2021-07-09 ENCOUNTER — Inpatient Hospital Stay (HOSPITAL_COMMUNITY): Payer: Medicare Other

## 2021-07-09 ENCOUNTER — Ambulatory Visit (HOSPITAL_COMMUNITY): Payer: Medicare Other | Admitting: Hematology

## 2021-07-10 ENCOUNTER — Other Ambulatory Visit (HOSPITAL_COMMUNITY): Payer: Medicare Other

## 2021-07-17 ENCOUNTER — Ambulatory Visit (HOSPITAL_COMMUNITY): Payer: Medicare Other | Admitting: Hematology

## 2021-08-07 ENCOUNTER — Other Ambulatory Visit (HOSPITAL_COMMUNITY): Payer: Self-pay

## 2021-08-07 DIAGNOSIS — C50511 Malignant neoplasm of lower-outer quadrant of right female breast: Secondary | ICD-10-CM

## 2021-08-07 MED ORDER — ANASTROZOLE 1 MG PO TABS: 1.0000 mg | ORAL_TABLET | Freq: Every day | ORAL | 6 refills | Status: DC

## 2021-08-07 NOTE — Telephone Encounter (Signed)
Chart reviewed anastrozole refilled per last office visit with Dr. Lorenso Courier.

## 2021-08-07 NOTE — Progress Notes (Signed)
Jill Wheeler, [name] [address]   Patient Care Team: Pcp, No as PCP - General [name], RN as Oncology Nurse Navigator (Oncology)  SUMMARY OF ONCOLOGIC HISTORY: Oncology History  Breast cancer of lower-outer quadrant of right female breast ([place])  01/03/2021 Initial Diagnosis   Breast cancer of lower-outer quadrant of right female breast ([place])   01/07/2021 Cancer Staging   Staging form: Breast, AJCC 8th Edition - Clinical stage from 01/07/2021: Stage IA (cT1a, cN0, cM0, G2, ER+, PR+, HER2+) - Signed by [name], MD on 01/07/2021 Stage prefix: Initial diagnosis Nuclear grade: G2 Histologic grading system: 3 grade system Ki-67 (%): 10      CHIEF COMPLIANT: Follow-up of invasive ductal carcinoma of right breast   INTERVAL HISTORY: Jill Wheeler is a 65 y.o. female here today for follow up of her invasive ductal carcinoma of right breast. Her last visit was on 01/07/2021.   Today she reports feeling good. She reports soreness in her right breast. She is taking anastrozole and tolerating it well; she denies hot flashes and joint pains.   REVIEW OF SYSTEMS:   Review of Systems  Constitutional:  Negative for appetite change and fatigue (65%).  Endocrine: Negative for hot flashes.  Musculoskeletal:  Negative for arthralgias.  All other systems reviewed and are negative.  I have reviewed the past medical history, past surgical history, social history and family history with the patient and they are unchanged from previous note.   ALLERGIES:   is allergic to lisinopril and penicillins.   MEDICATIONS:  Current Outpatient Medications  Medication Sig Dispense Refill   anastrozole (ARIMIDEX) 1 MG tablet Take 1 tablet (1 mg total) by mouth daily. 30 tablet 6   aspirin 81 MG EC tablet Take 81 mg by mouth daily.     atorvastatin (LIPITOR) 40 MG tablet 1 tab(s)     cetirizine (ZYRTEC) 10 MG tablet 1 tab(s)     docusate sodium  (COLACE) 100 MG capsule Take 100 mg by mouth 2 (two) times daily.     LORazepam (ATIVAN) 0.5 MG tablet      losartan-hydrochlorothiazide (HYZAAR) 100-25 MG tablet SMARTSIG:0.5 Tablet(s) By Mouth 1 to 2 Times Daily     ondansetron (ZOFRAN) 4 MG tablet Take by mouth.     pravastatin (PRAVACHOL) 40 MG tablet Take 1 tablet by mouth daily.     Vitamin D, Ergocalciferol, (DRISDOL) 1.25 MG (50000 UNIT) CAPS capsule 1 capsule     HYDROcodone-acetaminophen (NORCO/VICODIN) 5-325 MG tablet Take 1 tablet by mouth every 4 (four) hours as needed. (Patient not taking: Reported on 08/08/2021)     traMADol (ULTRAM) 50 MG tablet Take 50 mg by mouth 4 (four) times daily as needed. (Patient not taking: Reported on 08/08/2021)     No current facility-administered medications for this visit.     PHYSICAL EXAMINATION: Performance status (ECOG): 0 - Asymptomatic  Vitals:   08/08/21 1518  BP: (!) 148/78  Pulse: 89  Resp: 18  Temp: (!) 97 F (36.1 C)  SpO2: 98%   Wt Readings from Last 3 Encounters:  08/08/21 183 lb 9.6 oz (83.3 kg)  04/09/21 185 lb 3.2 oz (84 kg)  01/07/21 185 lb 14.4 oz (84.3 kg)   Physical Exam Vitals reviewed.  Constitutional:      Appearance: Normal appearance.  Cardiovascular:     Rate and Rhythm: Normal rate and regular rhythm.     Pulses: Normal pulses.  Heart sounds: Normal heart sounds.  Pulmonary:     Effort: Pulmonary effort is normal.     Breath sounds: Normal breath sounds.  Chest:  Breasts:    Right: Skin change (thickening at lateral quadrant lumpectomy site) present. No mass or tenderness.     Left: Normal. No mass, skin change or tenderness.  Abdominal:     Palpations: Abdomen is soft. There is no mass.     Tenderness: There is no abdominal tenderness.  Musculoskeletal:     Right lower leg: No edema.     Left lower leg: No edema.  Lymphadenopathy:     Upper Body:     Right upper body: No supraclavicular, axillary or pectoral adenopathy.     Left upper  body: No supraclavicular, axillary or pectoral adenopathy.  Neurological:     General: No focal deficit present.     Mental Status: She is alert and oriented to person, place, and time.  Psychiatric:        Mood and Affect: Mood normal.        Behavior: Behavior normal.    Breast Exam Chaperone: [name]     LABORATORY DATA:  I have reviewed the data as listed CMP Latest Ref Rng & Units 08/08/2021 04/09/2021 01/07/2021  Glucose 70 - 99 mg/dL 81 88 80  BUN 8 - 23 mg/dL 11 12 14   Creatinine 0.44 - 1.00 mg/dL 0.86 0.87 0.90  Sodium 135 - 145 mmol/L 138 140 134(L)  Potassium 3.5 - 5.1 mmol/L 3.9 4.0 3.8  Chloride 98 - 111 mmol/L 101 104 100  CO2 22 - 32 mmol/L 30 27 26   Calcium 8.9 - 10.3 mg/dL 9.2 9.3 9.1  Total Protein 6.5 - 8.1 g/dL 7.3 7.2 7.4  Total Bilirubin 0.3 - 1.2 mg/dL 0.6 0.5 0.5  Alkaline Phos 38 - 126 U/L 125 126 109  AST 15 - 41 U/L 17 22 21   ALT 0 - 44 U/L 11 16 13    No results found for: [id number] Lab Results  Component Value Date   WBC 8.1 08/08/2021   HGB 11.3 (L) 08/08/2021   HCT 35.2 (L) 08/08/2021   MCV 95.4 08/08/2021   PLT 248 08/08/2021   NEUTROABS 5.3 08/08/2021    ASSESSMENT:  1.  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 -Right breast ultrasound on 10/03/2020-0.4 cm mass at right breast 8 o'clock position -Biopsy of the mass on 10/17/2020. -MRI of the breast on 11/05/2020 with 1 cm enhancing mass in the right breast LOQ -Right breast upper outer quadrant mass biopsy on 11/19/2020 consistent with DCIS. -Right lumpectomy and SLNB on 12/12/2020 by Dr. Ladona Horns in [place]. -Pathology consistent with grade two 0.5 cm invasive ductal carcinoma, 0.8 cm to the closest anterior resection margin.  DCIS, high-grade with comedonecrosis, at least 3.2 cm, lesion comes to less than 0.1 cm of closest posterior resection margin.  0/2 sentinel lymph nodes positive for metastatic carcinoma.  ER-60%,  PR-90%, Ki-67-10%, HER-2 positive by FISH.  PT1aPN0.   2.  Social/family history: -She lives at home by herself.  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 -Daughter had breast cancer at age 68.  67 of her nieces had breast cancer in their 34s.  Maternal grandmother had breast cancer.  Paternal cousin had breast cancer.   PLAN:  1.  Stage Ia (T1 a N0) triple receptor positive right breast cancer: - Anastrozole started in March  2022. - Completed radiation therapy to the right breast and May 2022. - Physical examination today did not reveal any palpable masses.  Right breast lumpectomy scar in the lower outer quadrant is slightly tender with no masses.  Lower part of the breast is also slightly tender. - She is tolerating anastrozole very well.  Labs reviewed were within normal limits. - She will have mammogram done in [place] in December.  She will also follow-up with Dr. Ladona Horns. - RTC 6 months for follow-up with repeat labs.   2.  Family history: - She had genetic testing done which was negative.  3.  Bone health: - DEXA scan on 01/24/2021 T score 0.2, normal. - Continue calcium and 1000 units of vitamin D daily. - We will check vitamin D level at next visit.   Breast Cancer therapy associated bone loss: I have recommended calcium, Vitamin D and weight bearing exercises.  Orders placed this encounter:  No orders of the defined types were placed in this encounter.   The patient has a good understanding of the overall plan. She agrees with it. She will call with any problems that may develop before the next visit here.  [name], MD [place] [phone]   I, [name], am acting as a scribe for Dr. [name].  I, [name] MD, have reviewed the above documentation for accuracy and completeness, and I agree with the above.

## 2021-08-08 ENCOUNTER — Inpatient Hospital Stay (HOSPITAL_BASED_OUTPATIENT_CLINIC_OR_DEPARTMENT_OTHER): Payer: Medicare Other | Admitting: Hematology

## 2021-08-08 ENCOUNTER — Other Ambulatory Visit: Payer: Self-pay

## 2021-08-08 ENCOUNTER — Inpatient Hospital Stay (HOSPITAL_COMMUNITY): Payer: Medicare Other | Attending: Hematology

## 2021-08-08 VITALS — BP 148/78 | HR 89 | Temp 97.0°F | Resp 18 | Wt 183.6 lb

## 2021-08-08 DIAGNOSIS — Z79811 Long term (current) use of aromatase inhibitors: Secondary | ICD-10-CM | POA: Diagnosis not present

## 2021-08-08 DIAGNOSIS — Z170 Estrogen receptor positive status [ER+]: Secondary | ICD-10-CM | POA: Diagnosis not present

## 2021-08-08 DIAGNOSIS — C50511 Malignant neoplasm of lower-outer quadrant of right female breast: Secondary | ICD-10-CM | POA: Diagnosis not present

## 2021-08-08 DIAGNOSIS — Z923 Personal history of irradiation: Secondary | ICD-10-CM | POA: Insufficient documentation

## 2021-08-08 DIAGNOSIS — C50411 Malignant neoplasm of upper-outer quadrant of right female breast: Secondary | ICD-10-CM | POA: Diagnosis not present

## 2021-08-08 LAB — CBC WITH DIFFERENTIAL/PLATELET
Abs Immature Granulocytes: 0.03 10*3/uL (ref 0.00–0.07)
Basophils Absolute: 0.1 10*3/uL (ref 0.0–0.1)
Basophils Relative: 1 %
Eosinophils Absolute: 0.2 10*3/uL (ref 0.0–0.5)
Eosinophils Relative: 2 %
HCT: 35.2 % — ABNORMAL LOW (ref 36.0–46.0)
Hemoglobin: 11.3 g/dL — ABNORMAL LOW (ref 12.0–15.0)
Immature Granulocytes: 0 %
Lymphocytes Relative: 23 %
Lymphs Abs: 1.8 10*3/uL (ref 0.7–4.0)
MCH: 30.6 pg (ref 26.0–34.0)
MCHC: 32.1 g/dL (ref 30.0–36.0)
MCV: 95.4 fL (ref 80.0–100.0)
Monocytes Absolute: 0.7 10*3/uL (ref 0.1–1.0)
Monocytes Relative: 8 %
Neutro Abs: 5.3 10*3/uL (ref 1.7–7.7)
Neutrophils Relative %: 66 %
Platelets: 248 10*3/uL (ref 150–400)
RBC: 3.69 MIL/uL — ABNORMAL LOW (ref 3.87–5.11)
RDW: 13.3 % (ref 11.5–15.5)
WBC: 8.1 10*3/uL (ref 4.0–10.5)
nRBC: 0 % (ref 0.0–0.2)

## 2021-08-08 LAB — COMPREHENSIVE METABOLIC PANEL
ALT: 11 U/L (ref 0–44)
AST: 17 U/L (ref 15–41)
Albumin: 3.6 g/dL (ref 3.5–5.0)
Alkaline Phosphatase: 125 U/L (ref 38–126)
Anion gap: 7 (ref 5–15)
BUN: 11 mg/dL (ref 8–23)
CO2: 30 mmol/L (ref 22–32)
Calcium: 9.2 mg/dL (ref 8.9–10.3)
Chloride: 101 mmol/L (ref 98–111)
Creatinine, Ser: 0.86 mg/dL (ref 0.44–1.00)
GFR, Estimated: >60 mL/min (ref >60)
Glucose, Bld: 81 mg/dL (ref 70–99)
Potassium: 3.9 mmol/L (ref 3.5–5.1)
Sodium: 138 mmol/L (ref 135–145)
Total Bilirubin: 0.6 mg/dL (ref 0.3–1.2)
Total Protein: 7.3 g/dL (ref 6.5–8.1)

## 2021-08-08 LAB — VITAMIN D 25 HYDROXY (VIT D DEFICIENCY, FRACTURES): Vit D, 25-Hydroxy: 44.67 ng/mL (ref 30–100)

## 2021-08-08 NOTE — Patient Instructions (Addendum)
[place] at [facility] Discharge Instructions  You were seen and examined today by Dr. Delton Coombes. He recommends that you continue taking the Vitamin C and Vitamin D over the counter along with your Anastrozole as prescribed. Have your mammogram done. He reviewed your most recent labs and everything looks stable. Please keep follow up as scheduled.   Thank you for choosing [place] at [facility] to provide your oncology and hematology care.  To afford each patient quality time with our provider, please arrive at least 15 minutes before your scheduled appointment time.   If you have a lab appointment with the [facility] please come in thru the Main Entrance and check in at the main information desk.  You need to re-schedule your appointment should you arrive 10 or more minutes late.  We strive to give you quality time with our providers, and arriving late affects you and other patients whose appointments are after yours.  Also, if you no show three or more times for appointments you may be dismissed from the clinic at the providers discretion.     Again, thank you for choosing [facility].  Our hope is that these requests will decrease the amount of time that you wait before being seen by our physicians.       _____________________________________________________________  Should you have questions after your visit to [facility] ([facility]), please contact our office at [phone] and follow the prompts.  Our office hours are 8:00 a.m. and 4:30 p.m. Monday - Friday.  Please note that voicemails left after 4:00 p.m. may not be returned until the following business day.  We are closed weekends and major holidays.  You do have access to a nurse 24-7, just call the main number to the clinic [phone] and do not press any options, hold on the line and a nurse will answer the phone.    For prescription refill requests, have your  pharmacy contact our office and allow 72 hours.    Due to Covid, you will need to wear a mask upon entering the hospital. If you do not have a mask, a mask will be given to you at the Main Entrance upon arrival. For doctor visits, patients may have 1 support person age 86 or older with them. For treatment visits, patients can not have anyone with them due to social distancing guidelines and our immunocompromised population.

## 2021-09-07 IMAGING — MG MM BREAST BX W/ LOC DEV 1ST LESION IMAGE BX SPEC STEREO GUIDE*R*
8 of 10 series · 8 of 22 positions shown · non-contrast
Comparison: Previous exams.
COMPARISON: Previous exams.

Addendum:
CLINICAL DATA: Patient for stereotactic guided biopsy of
indeterminate right breast calcifications. Recent diagnosis right
breast cancer.

EXAM:
RIGHT BREAST STEREOTACTIC CORE NEEDLE BIOPSY

[R (1 of 6)]
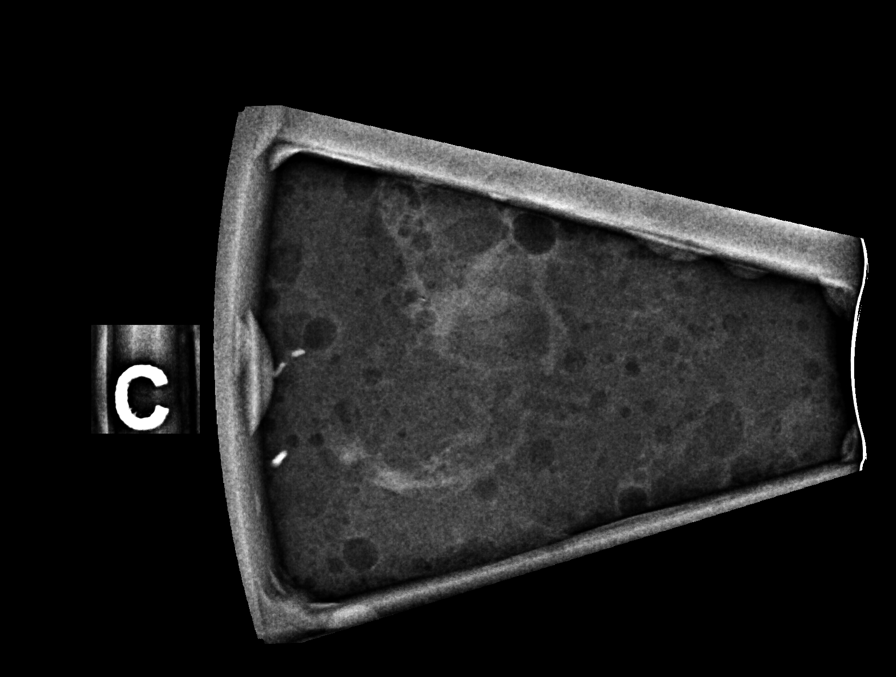

[R (2 of 6)]
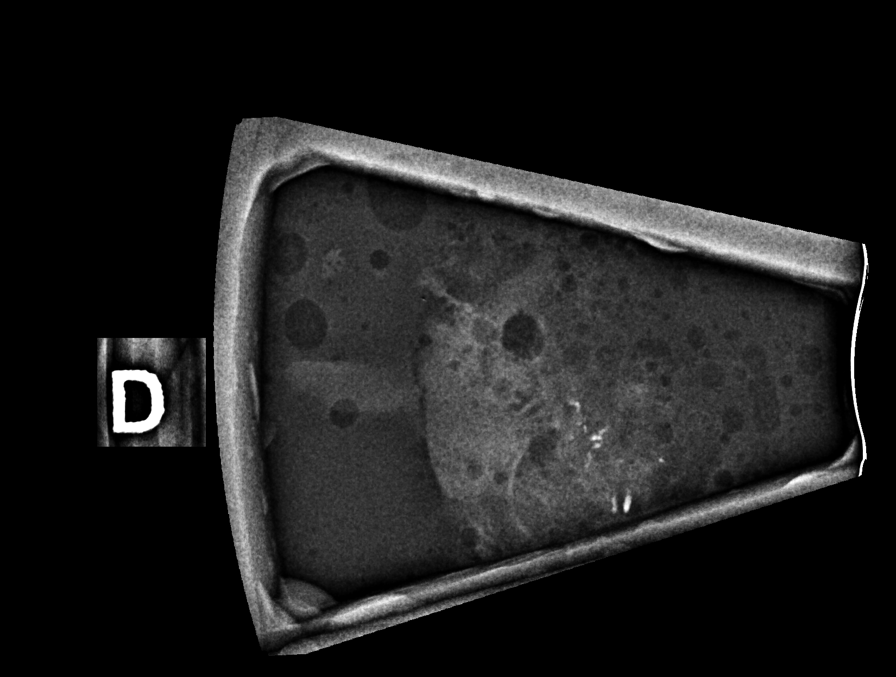

[R (3 of 6)]
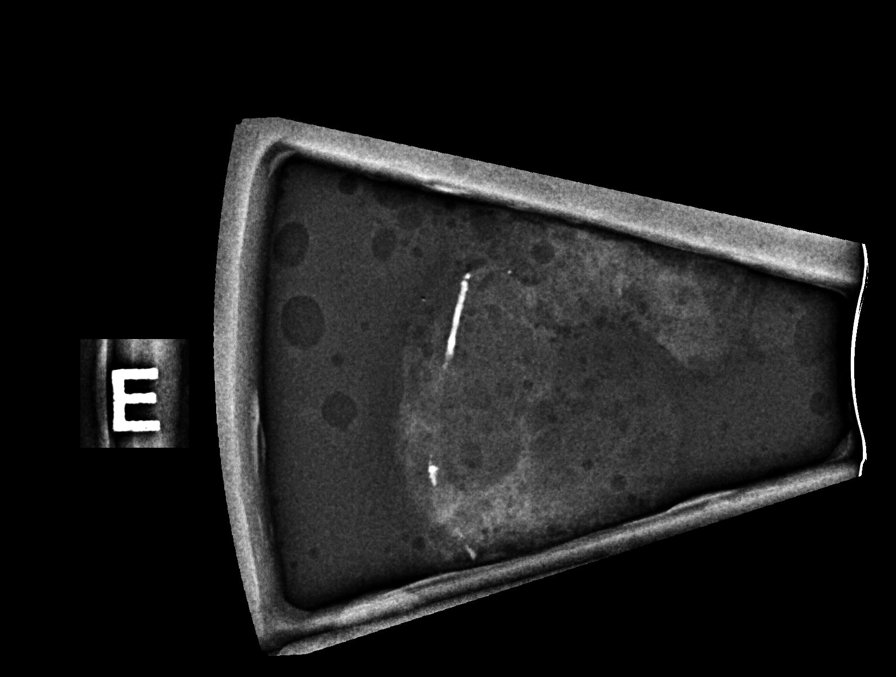

[R (4 of 6)]
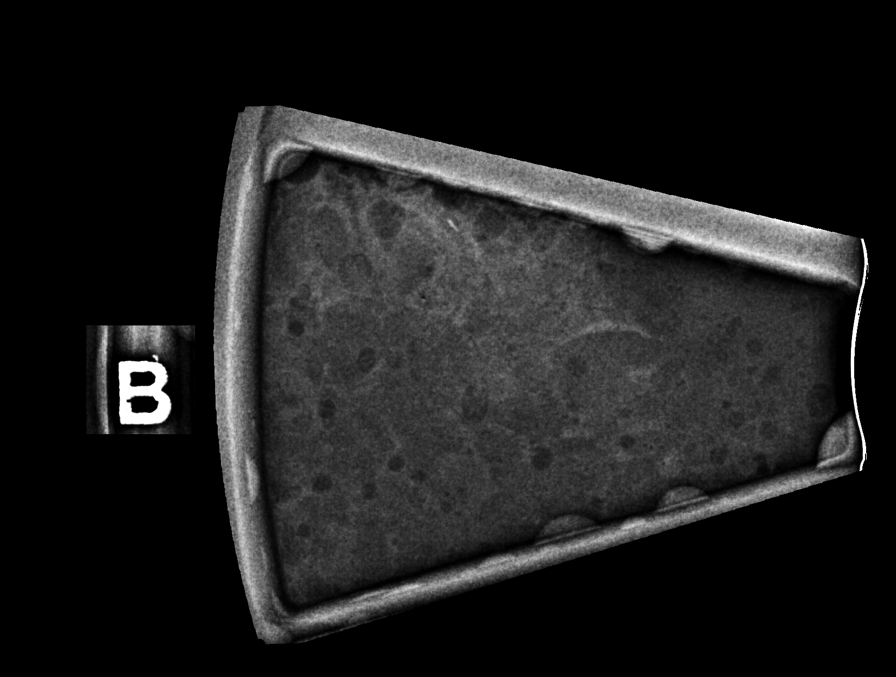

[R (5 of 6)]
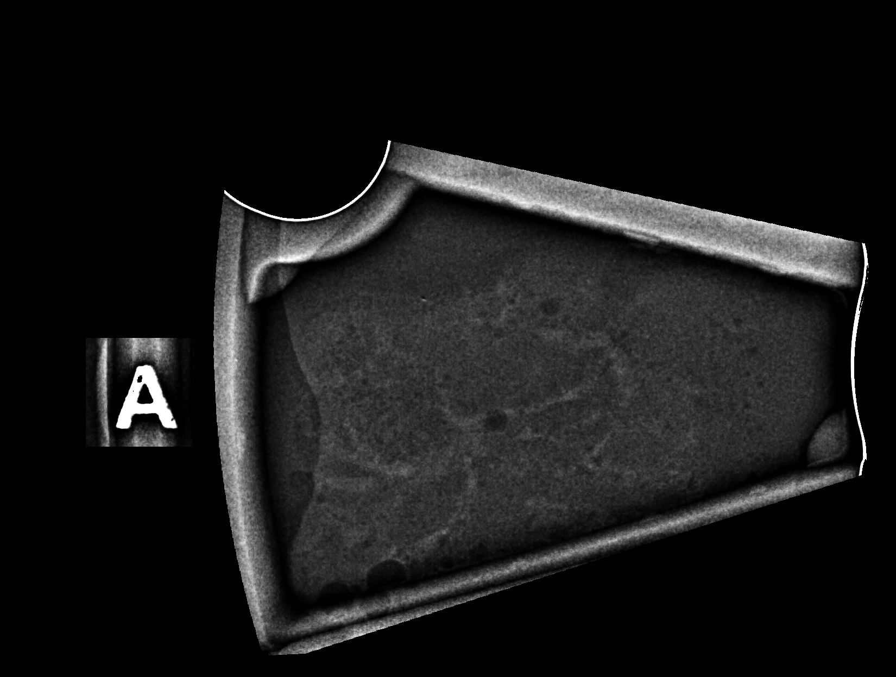

[R (6 of 6)]
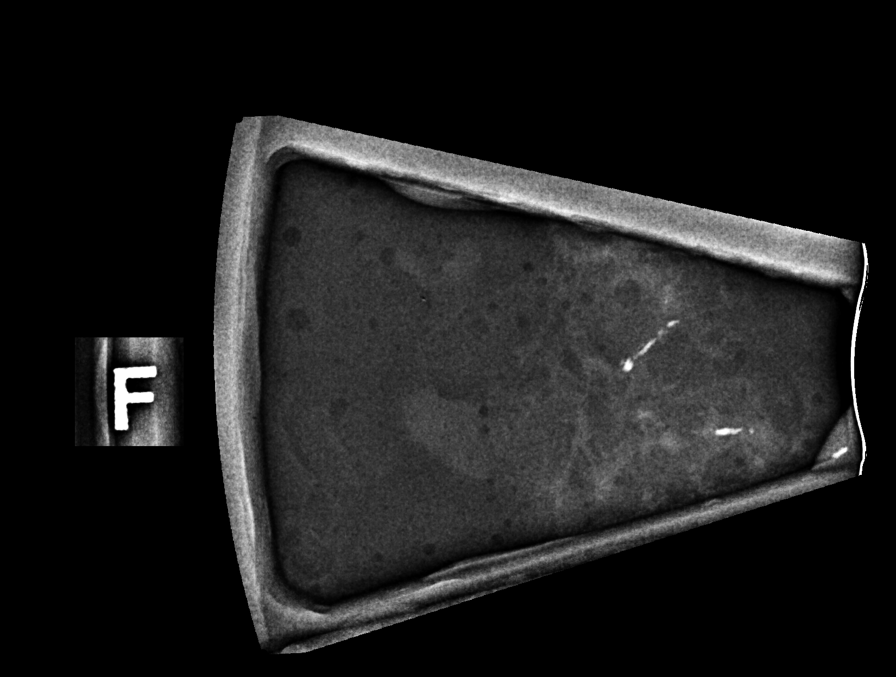

[R LM]
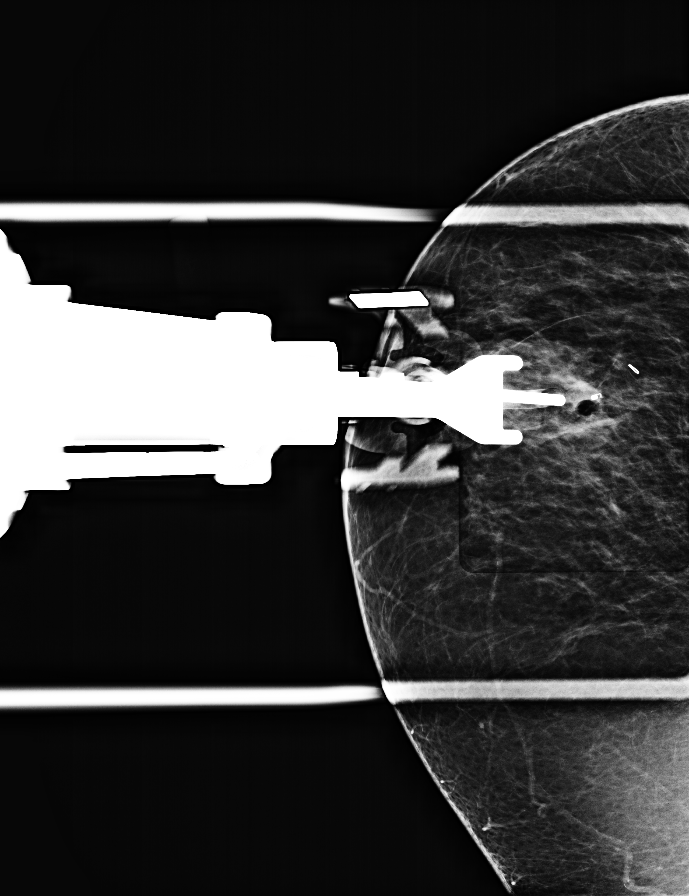

[R LM tomo · tomo slice 35/68.0]
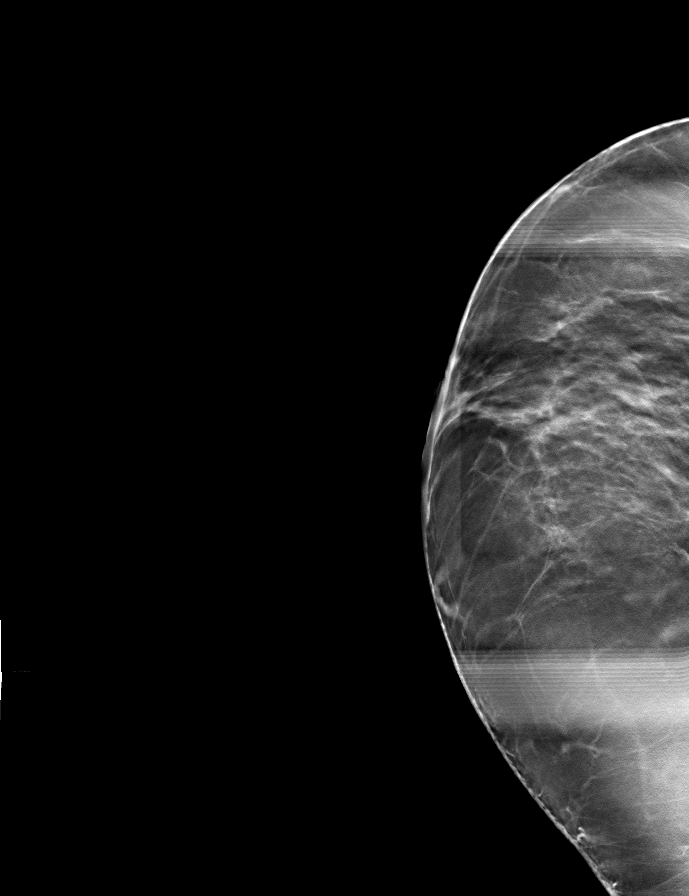

[8 of 22 positions shown; findings below may reference images not displayed]



Using sterile technique and 1% Lidocaine as local anesthetic, under
stereotactic guidance, a 9 gauge vacuum assisted device was used to
perform core needle biopsy of calcifications within the outer right
breast using a lateral approach. Specimen radiograph was performed
showing calcifications. Specimens with calcifications are identified
for pathology.

Lesion quadrant: Upper outer quadrant

At the conclusion of the procedure, coil shaped tissue marker clip
was deployed into the biopsy cavity. Follow-up 2-view mammogram was
performed and dictated separately.
IMPRESSION: Stereotactic-guided biopsy of right breast calcifications. No
apparent complications.

ADDENDUM:
Pathology revealed HIGH GRADE DUCTAL CARCINOMA IN SITU WITH
CALCIFICATIONS AND NECROSIS of the RIGHT breast, upper outer. This
was found to be concordant by Dr. Harofer Karriel Rhomero.

Pathology results were discussed with the patient by telephone. The
patient reported doing well after the biopsy with tenderness at the
site. Post biopsy instructions and care were reviewed and questions
were answered. The patient was encouraged to call The [REDACTED]

The patient has a recent diagnosis of right breast cancer and should
follow her outlined treatment plan.

Results and patient's need for a 2nd look ultrasound of the right
breast from recent MRI to be scheduled at [facility]HOSPITAL] [facility] were called
to [facility] Supervisor of [facility]REDACTED[facility] on November 20, 2020.

Pathology results reported by [facility] RN on 11/20/2020.



Using sterile technique and 1% Lidocaine as local anesthetic, under
stereotactic guidance, a 9 gauge vacuum assisted device was used to
perform core needle biopsy of calcifications within the outer right
breast using a lateral approach. Specimen radiograph was performed
showing calcifications. Specimens with calcifications are identified
for pathology.

Lesion quadrant: Upper outer quadrant

At the conclusion of the procedure, coil shaped tissue marker clip
was deployed into the biopsy cavity. Follow-up 2-view mammogram was
performed and dictated separately.
IMPRESSION: Stereotactic-guided biopsy of right breast calcifications. No
apparent complications.

## 2022-02-13 ENCOUNTER — Inpatient Hospital Stay (HOSPITAL_COMMUNITY): Payer: Medicare Other

## 2022-02-13 ENCOUNTER — Inpatient Hospital Stay (HOSPITAL_COMMUNITY): Payer: Medicare Other | Admitting: Hematology

## 2022-02-28 ENCOUNTER — Other Ambulatory Visit (HOSPITAL_COMMUNITY): Payer: Self-pay | Admitting: Hematology

## 2022-02-28 ENCOUNTER — Other Ambulatory Visit: Payer: Self-pay | Admitting: *Deleted

## 2022-02-28 DIAGNOSIS — C50511 Malignant neoplasm of lower-outer quadrant of right female breast: Secondary | ICD-10-CM

## 2022-02-28 NOTE — Telephone Encounter (Signed)
Refill sent in for Anastrozole per request.  Per last ovn, patient is tolerating and is to continue on therapy.

## 2022-04-08 ENCOUNTER — Inpatient Hospital Stay (HOSPITAL_COMMUNITY): Payer: Medicare Other | Attending: Hematology | Admitting: Hematology

## 2022-04-08 ENCOUNTER — Inpatient Hospital Stay (HOSPITAL_COMMUNITY): Payer: Medicare Other

## 2022-04-08 VITALS — BP 136/79 | HR 80 | Temp 97.8°F | Resp 18 | Ht 65.16 in | Wt 180.3 lb

## 2022-04-08 DIAGNOSIS — C50511 Malignant neoplasm of lower-outer quadrant of right female breast: Secondary | ICD-10-CM | POA: Diagnosis present

## 2022-04-08 DIAGNOSIS — R232 Flushing: Secondary | ICD-10-CM | POA: Diagnosis not present

## 2022-04-08 DIAGNOSIS — C50411 Malignant neoplasm of upper-outer quadrant of right female breast: Secondary | ICD-10-CM

## 2022-04-08 DIAGNOSIS — Z170 Estrogen receptor positive status [ER+]: Secondary | ICD-10-CM | POA: Diagnosis not present

## 2022-04-08 DIAGNOSIS — Z79899 Other long term (current) drug therapy: Secondary | ICD-10-CM | POA: Diagnosis not present

## 2022-04-08 DIAGNOSIS — Z79811 Long term (current) use of aromatase inhibitors: Secondary | ICD-10-CM | POA: Insufficient documentation

## 2022-04-08 LAB — COMPREHENSIVE METABOLIC PANEL
ALT: 15 U/L (ref 0–44)
AST: 20 U/L (ref 15–41)
Albumin: 3.7 g/dL (ref 3.5–5.0)
Alkaline Phosphatase: 123 U/L (ref 38–126)
Anion gap: 8 (ref 5–15)
BUN: 18 mg/dL (ref 8–23)
CO2: 30 mmol/L (ref 22–32)
Calcium: 9.6 mg/dL (ref 8.9–10.3)
Chloride: 103 mmol/L (ref 98–111)
Creatinine, Ser: 1.04 mg/dL — ABNORMAL HIGH (ref 0.44–1.00)
GFR, Estimated: 59 mL/min — ABNORMAL LOW (ref >60)
Glucose, Bld: 81 mg/dL (ref 70–99)
Potassium: 4.3 mmol/L (ref 3.5–5.1)
Sodium: 141 mmol/L (ref 135–145)
Total Bilirubin: 0.6 mg/dL (ref 0.3–1.2)
Total Protein: 7.5 g/dL (ref 6.5–8.1)

## 2022-04-08 LAB — CBC WITH DIFFERENTIAL/PLATELET
Abs Immature Granulocytes: 0.02 10*3/uL (ref 0.00–0.07)
Basophils Absolute: 0.1 10*3/uL (ref 0.0–0.1)
Basophils Relative: 1 %
Eosinophils Absolute: 0.1 10*3/uL (ref 0.0–0.5)
Eosinophils Relative: 1 %
HCT: 36.1 % (ref 36.0–46.0)
Hemoglobin: 11.5 g/dL — ABNORMAL LOW (ref 12.0–15.0)
Immature Granulocytes: 0 %
Lymphocytes Relative: 23 %
Lymphs Abs: 1.7 10*3/uL (ref 0.7–4.0)
MCH: 30.2 pg (ref 26.0–34.0)
MCHC: 31.9 g/dL (ref 30.0–36.0)
MCV: 94.8 fL (ref 80.0–100.0)
Monocytes Absolute: 0.6 10*3/uL (ref 0.1–1.0)
Monocytes Relative: 8 %
Neutro Abs: 5.1 10*3/uL (ref 1.7–7.7)
Neutrophils Relative %: 67 %
Platelets: 205 10*3/uL (ref 150–400)
RBC: 3.81 MIL/uL — ABNORMAL LOW (ref 3.87–5.11)
RDW: 13.7 % (ref 11.5–15.5)
WBC: 7.6 10*3/uL (ref 4.0–10.5)
nRBC: 0 % (ref 0.0–0.2)

## 2022-04-08 LAB — VITAMIN D 25 HYDROXY (VIT D DEFICIENCY, FRACTURES): Vit D, 25-Hydroxy: 52.54 ng/mL (ref 30–100)

## 2022-09-03 ENCOUNTER — Other Ambulatory Visit: Payer: Self-pay | Admitting: *Deleted

## 2022-09-03 DIAGNOSIS — C50511 Malignant neoplasm of lower-outer quadrant of right female breast: Secondary | ICD-10-CM

## 2022-09-03 MED ORDER — ANASTROZOLE 1 MG PO TABS: ORAL_TABLET | ORAL | 6 refills | Status: DC

## 2022-09-03 NOTE — Telephone Encounter (Signed)
Refills approved for Anastrozole.  Patient tolerating and is to continue therapy at this time.

## 2022-10-21 NOTE — Progress Notes (Signed)
Jill Wheeler, [place] [id number]   CLINIC:  Medical Oncology/Hematology  PCP:  Pcp, No None None  REASON FOR VISIT:  Follow-up for history of stage Ia invasive ductal carcinoma of the right breast  PRIOR THERAPY: - Right breast lumpectomy and SLNB (12/12/2020) - Anastrozole started March 2022 - XRT to right breast completed in May 2022  CURRENT THERAPY: Anastrozole (since March 2022)  BRIEF ONCOLOGIC HISTORY:   Oncology History  Breast cancer of lower-outer quadrant of right female breast ([place])  01/03/2021 Initial Diagnosis   Breast cancer of lower-outer quadrant of right female breast ([place])   01/07/2021 Cancer Staging   Staging form: Breast, AJCC 8th Edition - Clinical stage from 01/07/2021: Stage IA (cT1a, cN0, cM0, G2, ER+, PR+, HER2+) - Signed by [name], MD on 01/07/2021 Stage prefix: Initial diagnosis Nuclear grade: G2 Histologic grading system: 3 grade system Ki-67 (%): 10     CANCER STAGING: Cancer Staging  Breast cancer of lower-outer quadrant of right female breast [facility]) Staging form: Breast, AJCC 8th Edition - Clinical stage from 01/07/2021: Stage IA (cT1a, cN0, cM0, G2, ER+, PR+, HER2+) - Signed by [name], MD on 01/07/2021   INTERVAL HISTORY:   Ms. Jill Wheeler, a 67 y.o. female, returns for routine follow-up of her history of right-sided breast cancer. [place] was last seen on 04/08/2022 by Dr. Delton Coombes.   At today's visit, she  reports feeling well.  She denies any recent hospitalizations, surgeries, or changes in her  baseline health status.  She denies any symptoms of recurrence such as new lumps, bone pain, chest pain, dyspnea, or abdominal pain.  She has no new headaches, seizures, or focal neurologic deficits.  No B symptoms such as fever, chills, night sweats, unintentional weight loss.  She continues to take Arimidex daily, which she is tolerating well. She is taking calcium and vitamin D  daily.  She reports 75% energy and 100% appetite.  She is maintaining stable weight at this time.   ASSESSMENT & PLAN:  1.  Stage Ia (T1 a N0) right breast IDC, ER/PR/HER-2 positive: - Screening mammogram on 09/18/2020, BI-RADS Category 0. - Right breast ultrasound on 10/03/2020-0.4 cm mass at right breast 8 o'clock position - Biopsy of right breast mass on 10/17/2020. 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 - Right lumpectomy and SLNB on 12/12/2020 by Dr. Ladona Horns in [place]. - Pathology consistent with grade two 0.5 cm invasive ductal carcinoma, 0.8 cm to the closest anterior resection margin.  DCIS, high-grade with comedonecrosis, at least 3.2 cm, lesion comes to less than 0.1 cm of closest posterior resection margin.  0/2 sentinel lymph nodes positive for metastatic carcinoma.  ER-60%, PR-90%, Ki-67-10%, HER-2 positive by FISH.  PT1aPN0. - Anastrozole started in March 2022.  She is tolerating anastrozole reasonably well.   - Completed XRT to the right breast in May 2022 - Most recent mammogram (diagnostic mammogram at [facility] on 09/18/2021) showed no mammographic evidence of malignancy, both diagnostic mammogram suggested in 1 year. - Physical examination today shows right breast lumpectomy scar in the lower outer quadrant is within normal limits with no palpable masses or lymphadenopathy. - No "red flag" symptoms of recurrence - Labs today (10/22/2022): Normal CBC.  Mildly elevated alkaline phosphatase 140 noted on CMP, will monitor.  Counseled on hydration, BP control, and avoiding NSAIDs  based on mildly elevated creatinine 1.01. - PLAN: Patient is due for annual bilateral diagnostic mammogram.   - Labs and office visit in 6 months   2.  Bone health: - DEXA scan on 01/24/2021 T score 0.2. - Vitamin D today is PENDING  - PLAN: DEXA scan due April 2024 (every 2 years while on  anastrozole) - Continue calcium and vitamin D supplements.   Continue weightbearing exercises.  3.  Family history - Daughter had breast cancer at age 27.  Two of her nieces had breast cancer in their 62s.  Maternal grandmother had breast cancer.  Paternal cousin had breast cancer. - Genetic testing was negative.   4.  Social history: - She lives at home by herself.  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  PLAN SUMMARY: >> Due for diagnostic mammogram >> Bone density DEXA scan in April 2024 >> Same-day labs (CBC/D, CMP, Vitamin D) + OFFICE visit in 6 months    REVIEW OF SYSTEMS:   Review of Systems  Constitutional:  Negative for appetite change, chills, diaphoresis, fatigue, fever and unexpected weight change.  HENT:   Negative for lump/mass and nosebleeds.   Eyes:  Negative for eye problems.  Respiratory:  Negative for cough, hemoptysis and shortness of breath.   Cardiovascular:  Negative for chest pain, leg swelling and palpitations.  Gastrointestinal:  Negative for abdominal pain, blood in stool, constipation, diarrhea, nausea and vomiting.  Genitourinary:  Negative for hematuria.        Recurrent yeast infections  Skin: Negative.   Neurological:  Negative for dizziness, headaches and light-headedness.  Hematological:  Does not bruise/bleed easily.  Psychiatric/Behavioral:  The patient is nervous/anxious.     PHYSICAL EXAM:   Performance status (ECOG): 0 - Asymptomatic  There were no vitals filed for this visit. Wt Readings from Last 3 Encounters:  04/08/22 180 lb 5.4 oz (81.8 kg)  08/08/21 183 lb 9.6 oz (83.3 kg)  04/09/21 185 lb 3.2 oz (84 kg)   Physical Exam Constitutional:      Appearance: Normal appearance.  HENT:     Head: Normocephalic and atraumatic.     Mouth/Throat:     Mouth: Mucous membranes are moist.  Eyes:     Extraocular Movements: Extraocular movements intact.     Pupils: Pupils are equal, round, and reactive to light.  Cardiovascular:      Rate and Rhythm: Normal rate and regular rhythm.     Pulses: Normal pulses.     Heart sounds: Normal heart sounds.  Pulmonary:     Effort: Pulmonary effort is normal.     Breath sounds: Normal breath sounds.  Chest:       Comments: Right breast lumpectomy scar within normal limits.  No discrete nodules, masses, or adenopathy in either breast. Abdominal:     General: Bowel sounds are normal.     Palpations: Abdomen is soft.     Tenderness: There is no abdominal tenderness.  Musculoskeletal:        General: No swelling.     Right lower leg: No edema.     Left lower leg: No edema.  Lymphadenopathy:     Cervical: No cervical adenopathy.  Skin:    General: Skin is warm and dry.  Neurological:     General: No focal deficit present.     Mental Status: She is alert and oriented to person, place, and time.  Psychiatric:        Mood and Affect:  Mood normal.        Behavior: Behavior normal.      PAST MEDICAL/SURGICAL HISTORY:  Past Medical History:  Diagnosis Date   Family history of breast cancer     SOCIAL HISTORY:  Social History   Socioeconomic History   Marital status: Divorced    Spouse name: Not on file   Number of children: Not on file   Years of education: Not on file   Highest education level: Not on file  Occupational History   Not on file  Tobacco Use   Smoking status: Never   Smokeless tobacco: Never  Substance and Sexual Activity   Alcohol use: Not Currently   Drug use: Never   Sexual activity: Never  Other Topics Concern   Not on file  Social History Narrative   Not on file   Social Determinants of Health   Financial Resource Strain: Not on file  Food Insecurity: Not on file  Transportation Needs: Not on file  Physical Activity: Not on file  Stress: Not on file  Social Connections: Not on file  Intimate Partner Violence: Not At Risk (01/07/2021)   Humiliation, Afraid, Rape, and Kick questionnaire    Fear of Current or Ex-Partner: No     Emotionally Abused: No    Physically Abused: No    Sexually Abused: No    FAMILY HISTORY:  Family History  Problem Relation Age of Onset   Heart attack Maternal Uncle    Breast cancer Maternal Grandmother        dx 3s   Breast cancer Other        dx 20s (mother's first cousin)   Heart attack Maternal Uncle    Breast cancer Niece        dx 25s   Breast cancer Niece        dx 84s - had genetic test but result unknown   Breast cancer Daughter 88       negative genetic testing   Breast cancer Cousin 97       paternal first cousin    CURRENT MEDICATIONS:  Current Outpatient Medications  Medication Sig Dispense Refill   anastrozole (ARIMIDEX) 1 MG tablet TAKE 1 TABLET(1 MG) BY MOUTH DAILY 30 tablet 6   aspirin 81 MG EC tablet Take 81 mg by mouth daily.     atorvastatin (LIPITOR) 40 MG tablet 1 tab(s)     cetirizine (ZYRTEC) 10 MG tablet 1 tab(s)     docusate sodium (COLACE) 100 MG capsule Take 100 mg by mouth 2 (two) times daily.     HYDROcodone-acetaminophen (NORCO/VICODIN) 5-325 MG tablet Take 1 tablet by mouth every 4 (four) hours as needed.     LORazepam (ATIVAN) 0.5 MG tablet      losartan-hydrochlorothiazide (HYZAAR) 100-25 MG tablet SMARTSIG:0.5 Tablet(s) By Mouth 1 to 2 Times Daily     ondansetron (ZOFRAN) 4 MG tablet Take by mouth.     pravastatin (PRAVACHOL) 40 MG tablet Take 1 tablet by mouth daily.     traMADol (ULTRAM) 50 MG tablet Take 50 mg by mouth 4 (four) times daily as needed.     Vitamin D, Ergocalciferol, (DRISDOL) 1.25 MG (50000 UNIT) CAPS capsule 1 capsule     No current facility-administered medications for this visit.    ALLERGIES:  Allergies  Allergen Reactions   Lisinopril Swelling    Other reaction(s): swelling Tongue and throat swelled per pt Tongue and throat swelled per pt    Penicillins  LABORATORY DATA:  I have reviewed the labs as listed.     Latest Ref Rng & Units 04/08/2022    1:34 PM 08/08/2021    2:34 PM 04/09/2021    1:48  PM  CBC  WBC 4.0 - 10.5 K/uL 7.6  8.1  8.8   Hemoglobin 12.0 - 15.0 g/dL 11.5  11.3  11.5   Hematocrit 36.0 - 46.0 % 36.1  35.2  36.5   Platelets 150 - 400 K/uL 205  248  236       Latest Ref Rng & Units 04/08/2022    1:34 PM 08/08/2021    2:34 PM 04/09/2021    1:48 PM  CMP  Glucose 70 - 99 mg/dL 81  81  88   BUN 8 - 23 mg/dL '18  11  12   '$ Creatinine 0.44 - 1.00 mg/dL 1.04  0.86  0.87   Sodium 135 - 145 mmol/L 141  138  140   Potassium 3.5 - 5.1 mmol/L 4.3  3.9  4.0   Chloride 98 - 111 mmol/L 103  101  104   CO2 22 - 32 mmol/L '30  30  27   '$ Calcium 8.9 - 10.3 mg/dL 9.6  9.2  9.3   Total Protein 6.5 - 8.1 g/dL 7.5  7.3  7.2   Total Bilirubin 0.3 - 1.2 mg/dL 0.6  0.6  0.5   Alkaline Phos 38 - 126 U/L 123  125  126   AST 15 - 41 U/L '20  17  22   '$ ALT 0 - 44 U/L '15  11  16     '$ DIAGNOSTIC IMAGING:  I have independently reviewed the scans and discussed with the patient. No results found.   WRAP UP:  All questions were answered. The patient knows to call the clinic with any problems, questions or concerns.  Medical decision making: Moderate  Time spent on visit: I spent 20 minutes counseling the patient face to face. The total time spent in the appointment was 30 minutes and more than 50% was on counseling.  Harriett Rush, PA-C  10/22/22 11:03 AM

## 2022-10-22 ENCOUNTER — Other Ambulatory Visit (HOSPITAL_COMMUNITY): Payer: Self-pay | Admitting: Physician Assistant

## 2022-10-22 ENCOUNTER — Inpatient Hospital Stay: Payer: Medicaid Other | Attending: Hematology

## 2022-10-22 ENCOUNTER — Inpatient Hospital Stay (HOSPITAL_BASED_OUTPATIENT_CLINIC_OR_DEPARTMENT_OTHER): Payer: Medicaid Other | Admitting: Physician Assistant

## 2022-10-22 VITALS — BP 131/77 | HR 85 | Temp 98.4°F | Resp 16 | Wt 175.7 lb

## 2022-10-22 DIAGNOSIS — Z170 Estrogen receptor positive status [ER+]: Secondary | ICD-10-CM

## 2022-10-22 DIAGNOSIS — C50511 Malignant neoplasm of lower-outer quadrant of right female breast: Secondary | ICD-10-CM | POA: Insufficient documentation

## 2022-10-22 DIAGNOSIS — Z79811 Long term (current) use of aromatase inhibitors: Secondary | ICD-10-CM | POA: Diagnosis not present

## 2022-10-22 DIAGNOSIS — Z9889 Other specified postprocedural states: Secondary | ICD-10-CM

## 2022-10-22 LAB — CBC WITH DIFFERENTIAL/PLATELET
Abs Immature Granulocytes: 0.02 10*3/uL (ref 0.00–0.07)
Basophils Absolute: 0 10*3/uL (ref 0.0–0.1)
Basophils Relative: 1 %
Eosinophils Absolute: 0.1 10*3/uL (ref 0.0–0.5)
Eosinophils Relative: 1 %
HCT: 38.5 % (ref 36.0–46.0)
Hemoglobin: 12 g/dL (ref 12.0–15.0)
Immature Granulocytes: 0 %
Lymphocytes Relative: 17 %
Lymphs Abs: 1.3 10*3/uL (ref 0.7–4.0)
MCH: 29.5 pg (ref 26.0–34.0)
MCHC: 31.2 g/dL (ref 30.0–36.0)
MCV: 94.6 fL (ref 80.0–100.0)
Monocytes Absolute: 0.4 10*3/uL (ref 0.1–1.0)
Monocytes Relative: 6 %
Neutro Abs: 5.6 10*3/uL (ref 1.7–7.7)
Neutrophils Relative %: 75 %
Platelets: 246 10*3/uL (ref 150–400)
RBC: 4.07 MIL/uL (ref 3.87–5.11)
RDW: 13.1 % (ref 11.5–15.5)
WBC: 7.5 10*3/uL (ref 4.0–10.5)
nRBC: 0 % (ref 0.0–0.2)

## 2022-10-22 LAB — COMPREHENSIVE METABOLIC PANEL
ALT: 16 U/L (ref 0–44)
AST: 20 U/L (ref 15–41)
Albumin: 3.8 g/dL (ref 3.5–5.0)
Alkaline Phosphatase: 140 U/L — ABNORMAL HIGH (ref 38–126)
Anion gap: 10 (ref 5–15)
BUN: 13 mg/dL (ref 8–23)
CO2: 29 mmol/L (ref 22–32)
Calcium: 9.7 mg/dL (ref 8.9–10.3)
Chloride: 101 mmol/L (ref 98–111)
Creatinine, Ser: 1.01 mg/dL — ABNORMAL HIGH (ref 0.44–1.00)
GFR, Estimated: >60 mL/min (ref >60)
Glucose, Bld: 107 mg/dL — ABNORMAL HIGH (ref 70–99)
Potassium: 3.9 mmol/L (ref 3.5–5.1)
Sodium: 140 mmol/L (ref 135–145)
Total Bilirubin: 0.5 mg/dL (ref 0.3–1.2)
Total Protein: 7.4 g/dL (ref 6.5–8.1)

## 2022-10-22 NOTE — Patient Instructions (Signed)
[place] [facility] [place] **   You were seen today by Tarri Abernethy PA-C for your history of breast cancer.   You did not have any signs of recurrent breast cancer based on your lab work or physical exam today. You are due for your annual diagnostic mammogram. You will need bone density scan in April 2024.  MEDICATIONS: Continue daily Arimidex.  Continue calcium and vitamin D supplements.  FOLLOW-UP APPOINTMENT: Same-day labs and office visit in 6 months  ** Thank you for trusting me with your healthcare!  I strive to provide all of my patients with quality care at each visit.  If you receive a survey for this visit, I would be so grateful to you for taking the time to provide feedback.  Thank you in advance!  ~ Hulen Mandler                   Dr. [name]   &   Tarri Abernethy, PA-C   - - - - - - - - - - - - - - - - - -    Thank you for choosing [place] [facility] to provide your oncology and hematology care.  To afford each patient quality time with our provider, please arrive at least 15 minutes before your scheduled appointment time.   If you have a lab appointment with the [facility] please come in thru the Main Entrance and check in at the main information desk.  You need to re-schedule your appointment should you arrive 10 or more minutes late.  We strive to give you quality time with our providers, and arriving late affects you and other patients whose appointments are after yours.  Also, if you no show three or more times for appointments you may be dismissed from the clinic at the providers discretion.     Again, thank you for choosing [facility].  Our hope is that these requests will decrease the amount of time that you wait before being seen by our physicians.       _____________________________________________________________  Should you have questions  after your visit to [facility], please contact our office at [phone] and follow the prompts.  Our office hours are 8:00 a.m. and 4:30 p.m. Monday - Friday.  Please note that voicemails left after 4:00 p.m. may not be returned until the following business day.  We are closed weekends and major holidays.  You do have access to a nurse 24-7, just call the main number to the clinic [phone] and do not press any options, hold on the line and a nurse will answer the phone.    For prescription refill requests, have your pharmacy contact our office and allow 72 hours.

## 2022-10-24 LAB — MISC LABCORP TEST (SEND OUT): Labcorp test code: 81950

## 2022-11-11 ENCOUNTER — Ambulatory Visit (HOSPITAL_COMMUNITY)
Admission: RE | Admit: 2022-11-11 | Discharge: 2022-11-11 | Disposition: A | Discharge status: Home / Self Care | Payer: Medicaid Other | Source: Ambulatory Visit | Attending: Physician Assistant | Admitting: Physician Assistant

## 2022-11-11 DIAGNOSIS — Z9889 Other specified postprocedural states: Secondary | ICD-10-CM

## 2022-11-11 DIAGNOSIS — Z170 Estrogen receptor positive status [ER+]: Secondary | ICD-10-CM | POA: Insufficient documentation

## 2022-11-11 DIAGNOSIS — C50511 Malignant neoplasm of lower-outer quadrant of right female breast: Secondary | ICD-10-CM | POA: Diagnosis not present

## 2023-01-29 ENCOUNTER — Other Ambulatory Visit (HOSPITAL_COMMUNITY): Payer: Medicare HMO

## 2023-03-05 ENCOUNTER — Other Ambulatory Visit: Payer: Self-pay | Admitting: *Deleted

## 2023-03-05 ENCOUNTER — Other Ambulatory Visit: Payer: Self-pay | Admitting: Hematology

## 2023-03-05 DIAGNOSIS — C50511 Malignant neoplasm of lower-outer quadrant of right female breast: Secondary | ICD-10-CM

## 2023-03-05 NOTE — Telephone Encounter (Signed)
Anastrozole refill approved.  Patient tolerating and is to continue therapy. 

## 2023-04-22 ENCOUNTER — Ambulatory Visit: Payer: Medicare HMO | Admitting: Physician Assistant

## 2023-04-22 ENCOUNTER — Other Ambulatory Visit: Payer: Medicare HMO

## 2023-04-22 ENCOUNTER — Inpatient Hospital Stay: Payer: Medicare HMO

## 2023-04-22 ENCOUNTER — Inpatient Hospital Stay: Payer: Medicare HMO | Admitting: Physician Assistant

## 2023-04-23 ENCOUNTER — Ambulatory Visit: Payer: Medicare HMO | Admitting: Physician Assistant

## 2023-04-23 ENCOUNTER — Other Ambulatory Visit: Payer: Medicare HMO

## 2023-06-12 ENCOUNTER — Inpatient Hospital Stay: Payer: Medicare Other | Admitting: Oncology

## 2023-06-12 ENCOUNTER — Inpatient Hospital Stay: Payer: Medicare Other | Attending: Hematology

## 2023-06-12 VITALS — BP 116/67 | HR 98 | Temp 98.9°F | Ht 64.5 in | Wt 176.1 lb

## 2023-06-12 DIAGNOSIS — Z79811 Long term (current) use of aromatase inhibitors: Secondary | ICD-10-CM | POA: Diagnosis not present

## 2023-06-12 DIAGNOSIS — R5383 Other fatigue: Secondary | ICD-10-CM | POA: Insufficient documentation

## 2023-06-12 DIAGNOSIS — Z170 Estrogen receptor positive status [ER+]: Secondary | ICD-10-CM | POA: Insufficient documentation

## 2023-06-12 DIAGNOSIS — C50511 Malignant neoplasm of lower-outer quadrant of right female breast: Secondary | ICD-10-CM

## 2023-06-12 LAB — CBC WITH DIFFERENTIAL/PLATELET
Abs Immature Granulocytes: 0.03 10*3/uL (ref 0.00–0.07)
Basophils Absolute: 0 10*3/uL (ref 0.0–0.1)
Basophils Relative: 1 %
Eosinophils Absolute: 0.1 10*3/uL (ref 0.0–0.5)
Eosinophils Relative: 1 %
HCT: 37.4 % (ref 36.0–46.0)
Hemoglobin: 11.9 g/dL — ABNORMAL LOW (ref 12.0–15.0)
Immature Granulocytes: 0 %
Lymphocytes Relative: 16 %
Lymphs Abs: 1.3 10*3/uL (ref 0.7–4.0)
MCH: 29.7 pg (ref 26.0–34.0)
MCHC: 31.8 g/dL (ref 30.0–36.0)
MCV: 93.3 fL (ref 80.0–100.0)
Monocytes Absolute: 0.5 10*3/uL (ref 0.1–1.0)
Monocytes Relative: 6 %
Neutro Abs: 6.2 10*3/uL (ref 1.7–7.7)
Neutrophils Relative %: 76 %
Platelets: 225 10*3/uL (ref 150–400)
RBC: 4.01 MIL/uL (ref 3.87–5.11)
RDW: 13.2 % (ref 11.5–15.5)
WBC: 8.2 10*3/uL (ref 4.0–10.5)
nRBC: 0 % (ref 0.0–0.2)

## 2023-06-12 LAB — COMPREHENSIVE METABOLIC PANEL
ALT: 15 U/L (ref 0–44)
AST: 19 U/L (ref 15–41)
Albumin: 3.6 g/dL (ref 3.5–5.0)
Alkaline Phosphatase: 138 U/L — ABNORMAL HIGH (ref 38–126)
Anion gap: 6 (ref 5–15)
BUN: 16 mg/dL (ref 8–23)
CO2: 29 mmol/L (ref 22–32)
Calcium: 9.1 mg/dL (ref 8.9–10.3)
Chloride: 104 mmol/L (ref 98–111)
Creatinine, Ser: 0.97 mg/dL (ref 0.44–1.00)
GFR, Estimated: >60 mL/min (ref >60)
Glucose, Bld: 106 mg/dL — ABNORMAL HIGH (ref 70–99)
Potassium: 3.3 mmol/L — ABNORMAL LOW (ref 3.5–5.1)
Sodium: 139 mmol/L (ref 135–145)
Total Bilirubin: 0.6 mg/dL (ref 0.3–1.2)
Total Protein: 7.1 g/dL (ref 6.5–8.1)

## 2023-06-12 LAB — VITAMIN D 25 HYDROXY (VIT D DEFICIENCY, FRACTURES): Vit D, 25-Hydroxy: 43.2 ng/mL (ref 30–100)

## 2023-06-12 NOTE — Progress Notes (Signed)
[facility] [address], [place] [address]   CLINIC:  Medical Oncology/Hematology  PCP:  Pcp, No None None  REASON FOR VISIT:  Follow-up for history of stage Ia invasive ductal carcinoma of the right breast  PRIOR THERAPY: - Right breast lumpectomy and SLNB (12/12/2020) - Anastrozole started March 2022 - XRT to right breast completed in May 2022  CURRENT THERAPY: Anastrozole (since March 2022)  BRIEF ONCOLOGIC HISTORY:   Oncology History  Breast cancer of lower-outer quadrant of right female breast (HCC)  01/03/2021 Initial Diagnosis   Breast cancer of lower-outer quadrant of right female breast (HCC)   01/07/2021 Cancer Staging   Staging form: Breast, AJCC 8th Edition - Clinical stage from 01/07/2021: Stage IA (cT1a, cN0, cM0, G2, ER+, PR+, HER2+) - Signed by Jill Massed, MD on 01/07/2021 Stage prefix: Initial diagnosis Nuclear grade: G2 Histologic grading system: 3 grade system Ki-67 (%): 10     CANCER STAGING:  Cancer Staging  Breast cancer of lower-outer quadrant of right female breast [facility]) Staging form: Breast, AJCC 8th Edition - Clinical stage from 01/07/2021: Stage IA (cT1a, cN0, cM0, G2, ER+, PR+, HER2+) - Signed by Jill Massed, MD on 01/07/2021   INTERVAL HISTORY:   Jill Wheeler, a 67 y.o. female, returns for routine follow-up of her history of right-sided breast cancer. Jill Wheeler was last seen on 10/22/2022 by Jill Brenner, PA.  In the interim, she reports doing well.  Appetite is at 100% energy levels are 75%.  She denies any pain.   Denies any new lumps or bumps.  She continues taking Arimidex daily and tolerating well.  She is taking calcium and vitamin D supplements.  Reports occasional fatigue but feels it is normal for her age.  Denies any joint pain.    ASSESSMENT & PLAN:  1.  Stage Ia (T1 a N0) right breast IDC, ER/PR/HER-2 positive: - Screening mammogram on 09/18/2020, BI-RADS Category 0. - Right  breast ultrasound on 10/03/2020-0.4 cm mass at right breast 8 o'clock position - Biopsy of right breast mass on 10/17/2020. 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 - Right lumpectomy and SLNB on 12/12/2020 by Dr. Marcha Solders in [place]. - Pathology consistent with grade two 0.5 cm invasive ductal carcinoma, 0.8 cm to the closest anterior resection margin.  DCIS, high-grade with comedonecrosis, at least 3.2 cm, lesion comes to less than 0.1 cm of closest posterior resection margin.  0/2 sentinel lymph nodes positive for metastatic carcinoma.  ER-60%, PR-90%, Ki-67-10%, HER-2 positive by FISH.  PT1aPN0. - Anastrozole started in March 2022.  She is tolerating anastrozole reasonably well.   - Completed XRT to the right breast in May 2022 -Most recent mammogram from 11/11/2022 was BI-RADS Category 2 benign.  Will order mammogram for January 2025. -Labs from today 06/12/2023 show hemoglobin of 11.9 with an otherwise unremarkable differential.  CMP reveals potassium level of 3.3 and elevated alkaline phosphatase at 138. -We discussed foods that were high in potassium including bananas which she will try to incorporate more of into her diet. -Return to clinic in 6 months for follow-up with labs and virtual visit.  Will have screening mammogram in 5 months.  2.  Bone health: - DEXA scan on 01/24/2021 T score 0.2. -Vitamin D level from 04/08/2022 is 52.54.  Vitamin D level from today is pending. -Never scheduled previous bone  density so we will reorder and get this scheduled in the next month or so. -Will call with results from DEXA scan. -For now continue calcium and vitamin D supplements.  3.  Family history - Daughter had breast cancer at age 80.  Two of her nieces had breast cancer in their 30s.  Maternal grandmother had breast cancer.  Paternal cousin had breast cancer. - Genetic testing was  negative.   4.  Social history: - She lives at home by herself.  She worked as a Lawyer.  She is a non-smoker.  PLAN SUMMARY: >> Schedule mammogram and bone density in 4 to 5 months. >> RTC after both scans for lab work and a virtual telephone call a few days later.    REVIEW OF SYSTEMS:   Review of Systems  Constitutional:  Positive for fatigue.  Endocrine: Negative for hot flashes.  Musculoskeletal:  Negative for myalgias.    PHYSICAL EXAM:   Performance status (ECOG): 0 - Asymptomatic  Vitals:   06/12/23 1103  BP: 116/67  Pulse: 98  Temp: 98.9 F (37.2 C)   Wt Readings from Last 3 Encounters:  06/12/23 176 lb 2.4 oz (79.9 kg)  10/22/22 175 lb 11.3 oz (79.7 kg)  04/08/22 180 lb 5.4 oz (81.8 kg)   Physical Exam Constitutional:      Appearance: Normal appearance.  Cardiovascular:     Rate and Rhythm: Normal rate and regular rhythm.  Pulmonary:     Effort: Pulmonary effort is normal.     Breath sounds: Normal breath sounds.  Abdominal:     General: Bowel sounds are normal.     Palpations: Abdomen is soft.  Musculoskeletal:        General: No swelling. Normal range of motion.  Neurological:     Mental Status: She is alert and oriented to person, place, and time. Mental status is at baseline.      PAST MEDICAL/SURGICAL HISTORY:  Past Medical History:  Diagnosis Date   Family history of breast cancer     SOCIAL HISTORY:  Social History   Socioeconomic History   Marital status: Divorced    Spouse name: Not on file   Number of children: Not on file   Years of education: Not on file   Highest education level: Not on file  Occupational History   Not on file  Tobacco Use   Smoking status: Never   Smokeless tobacco: Never  Substance and Sexual Activity   Alcohol use: Not Currently   Drug use: Never   Sexual activity: Never  Other Topics Concern   Not on file  Social History Narrative   Not on file   Social Determinants of Health   Financial Resource  Strain: Not on file  Food Insecurity: Not on file  Transportation Needs: Not on file  Physical Activity: Not on file  Stress: Not on file  Social Connections: Not on file  Intimate Partner Violence: Not At Risk (01/07/2021)   Humiliation, Afraid, Rape, and Kick questionnaire    Fear of Current or Ex-Partner: No    Emotionally Abused: No    Physically Abused: No    Sexually Abused: No    FAMILY HISTORY:  Family History  Problem Relation Age of Onset   Heart attack Maternal Uncle    Breast cancer Maternal Grandmother        dx 59s   Breast cancer Other        dx 55s (mother's first cousin)   Heart attack  Maternal Uncle    Breast cancer Niece        dx 30s   Breast cancer Niece        dx 62s - had genetic test but result unknown   Breast cancer Daughter 67       negative genetic testing   Breast cancer Cousin 61       paternal first cousin    CURRENT MEDICATIONS:  Current Outpatient Medications  Medication Sig Dispense Refill   anastrozole (ARIMIDEX) 1 MG tablet TAKE 1 TABLET BY MOUTH EVERY DAY 90 tablet 2   aspirin 81 MG EC tablet Take 81 mg by mouth daily.     atorvastatin (LIPITOR) 40 MG tablet 1 tab(s) (Patient not taking: Reported on 06/12/2023)     cetirizine (ZYRTEC) 10 MG tablet 1 tab(s)     docusate sodium (COLACE) 100 MG capsule Take 100 mg by mouth 2 (two) times daily.     HYDROcodone-acetaminophen (NORCO/VICODIN) 5-325 MG tablet Take 1 tablet by mouth every 4 (four) hours as needed.     LORazepam (ATIVAN) 0.5 MG tablet  (Patient not taking: Reported on 06/12/2023)     losartan-hydrochlorothiazide (HYZAAR) 100-25 MG tablet 1 tablet daily.     ondansetron (ZOFRAN) 4 MG tablet Take by mouth.     pravastatin (PRAVACHOL) 40 MG tablet Take 1 tablet by mouth daily.     traMADol (ULTRAM) 50 MG tablet Take 50 mg by mouth 4 (four) times daily as needed.     Vitamin D, Ergocalciferol, (DRISDOL) 1.25 MG (50000 UNIT) CAPS capsule Take 50,000 Units by mouth daily.     No  current facility-administered medications for this visit.    ALLERGIES:  Allergies  Allergen Reactions   Lisinopril Swelling and Other (See Comments)    Other reaction(s): swelling  Tongue and throat swelled per pt   Penicillins     LABORATORY DATA:  I have reviewed the labs as listed.     Latest Ref Rng & Units 10/22/2022    9:34 AM 04/08/2022    1:34 PM 08/08/2021    2:34 PM  CBC  WBC 4.0 - 10.5 K/uL 7.5  7.6  8.1   Hemoglobin 12.0 - 15.0 g/dL 82.9  56.2  13.0   Hematocrit 36.0 - 46.0 % 38.5  36.1  35.2   Platelets 150 - 400 K/uL 246  205  248       Latest Ref Rng & Units 10/22/2022    9:34 AM 04/08/2022    1:34 PM 08/08/2021    2:34 PM  CMP  Glucose 70 - 99 mg/dL 865  81  81   BUN 8 - 23 mg/dL 13  18  11    Creatinine 0.44 - 1.00 mg/dL 7.84  6.96  2.95   Sodium 135 - 145 mmol/L 140  141  138   Potassium 3.5 - 5.1 mmol/L 3.9  4.3  3.9   Chloride 98 - 111 mmol/L 101  103  101   CO2 22 - 32 mmol/L 29  30  30    Calcium 8.9 - 10.3 mg/dL 9.7  9.6  9.2   Total Protein 6.5 - 8.1 g/dL 7.4  7.5  7.3   Total Bilirubin 0.3 - 1.2 mg/dL 0.5  0.6  0.6   Alkaline Phos 38 - 126 U/L 140  123  125   AST 15 - 41 U/L 20  20  17    ALT 0 - 44 U/L 16  15  11  DIAGNOSTIC IMAGING:  I have independently reviewed the scans and discussed with the patient. No results found.   WRAP UP:  All questions were answered. The patient knows to call the clinic with any problems, questions or concerns.  Medical decision making: Moderate  Time spent on visit: I spent 20 minutes dedicated to the care of this patient (face-to-face and non-face-to-face) on the date of the encounter to include what is described in the assessment and plan.  Mauro Kaufmann, NP  06/12/23 11:20 AM

## 2023-10-20 ENCOUNTER — Other Ambulatory Visit: Payer: Self-pay | Admitting: Hematology

## 2023-10-20 DIAGNOSIS — C50511 Malignant neoplasm of lower-outer quadrant of right female breast: Secondary | ICD-10-CM

## 2023-11-13 ENCOUNTER — Other Ambulatory Visit: Payer: Self-pay

## 2023-11-13 DIAGNOSIS — C50511 Malignant neoplasm of lower-outer quadrant of right female breast: Secondary | ICD-10-CM

## 2023-11-16 ENCOUNTER — Ambulatory Visit (HOSPITAL_COMMUNITY): Payer: Medicare Other

## 2023-11-16 ENCOUNTER — Other Ambulatory Visit (HOSPITAL_COMMUNITY): Payer: Medicare Other

## 2023-11-16 ENCOUNTER — Inpatient Hospital Stay: Payer: Self-pay

## 2023-11-27 ENCOUNTER — Inpatient Hospital Stay: Payer: Self-pay | Admitting: Oncology

## 2023-12-03 ENCOUNTER — Inpatient Hospital Stay: Payer: Medicare HMO

## 2023-12-03 ENCOUNTER — Ambulatory Visit (HOSPITAL_COMMUNITY): Payer: Self-pay

## 2023-12-03 ENCOUNTER — Other Ambulatory Visit (HOSPITAL_COMMUNITY): Payer: Self-pay

## 2023-12-16 ENCOUNTER — Encounter (HOSPITAL_COMMUNITY): Payer: Self-pay

## 2023-12-16 ENCOUNTER — Inpatient Hospital Stay: Payer: Medicare HMO | Admitting: Oncology

## 2023-12-16 ENCOUNTER — Inpatient Hospital Stay: Payer: Medicare HMO | Attending: Hematology

## 2023-12-16 ENCOUNTER — Ambulatory Visit (HOSPITAL_COMMUNITY)
Admission: RE | Admit: 2023-12-16 | Discharge: 2023-12-16 | Disposition: A | Discharge status: Home / Self Care | Payer: Self-pay | Source: Ambulatory Visit | Attending: Oncology | Admitting: Oncology

## 2023-12-16 DIAGNOSIS — Z853 Personal history of malignant neoplasm of breast: Secondary | ICD-10-CM | POA: Insufficient documentation

## 2023-12-16 DIAGNOSIS — C50511 Malignant neoplasm of lower-outer quadrant of right female breast: Secondary | ICD-10-CM

## 2023-12-16 DIAGNOSIS — Z923 Personal history of irradiation: Secondary | ICD-10-CM | POA: Diagnosis not present

## 2023-12-16 DIAGNOSIS — Z1231 Encounter for screening mammogram for malignant neoplasm of breast: Secondary | ICD-10-CM | POA: Insufficient documentation

## 2023-12-16 DIAGNOSIS — Z13820 Encounter for screening for osteoporosis: Secondary | ICD-10-CM | POA: Diagnosis present

## 2023-12-16 DIAGNOSIS — Z79811 Long term (current) use of aromatase inhibitors: Secondary | ICD-10-CM | POA: Diagnosis not present

## 2023-12-16 DIAGNOSIS — D649 Anemia, unspecified: Secondary | ICD-10-CM | POA: Insufficient documentation

## 2023-12-16 DIAGNOSIS — Z780 Asymptomatic menopausal state: Secondary | ICD-10-CM | POA: Diagnosis not present

## 2023-12-16 DIAGNOSIS — Z170 Estrogen receptor positive status [ER+]: Secondary | ICD-10-CM | POA: Diagnosis present

## 2023-12-16 LAB — VITAMIN D 25 HYDROXY (VIT D DEFICIENCY, FRACTURES): Vit D, 25-Hydroxy: 35.04 ng/mL (ref 30–100)

## 2023-12-16 LAB — CBC WITH DIFFERENTIAL/PLATELET
Abs Immature Granulocytes: 0.04 K/uL (ref 0.00–0.07)
Basophils Absolute: 0 K/uL (ref 0.0–0.1)
Basophils Relative: 0 %
Eosinophils Absolute: 0.1 K/uL (ref 0.0–0.5)
Eosinophils Relative: 1 %
HCT: 35.8 % — ABNORMAL LOW (ref 36.0–46.0)
Hemoglobin: 11 g/dL — ABNORMAL LOW (ref 12.0–15.0)
Immature Granulocytes: 0 %
Lymphocytes Relative: 17 %
Lymphs Abs: 1.6 K/uL (ref 0.7–4.0)
MCH: 28.9 pg (ref 26.0–34.0)
MCHC: 30.7 g/dL (ref 30.0–36.0)
MCV: 94.2 fL (ref 80.0–100.0)
Monocytes Absolute: 0.7 K/uL (ref 0.1–1.0)
Monocytes Relative: 7 %
Neutro Abs: 7.2 K/uL (ref 1.7–7.7)
Neutrophils Relative %: 75 %
Platelets: 264 K/uL (ref 150–400)
RBC: 3.8 MIL/uL — ABNORMAL LOW (ref 3.87–5.11)
RDW: 13.7 % (ref 11.5–15.5)
WBC: 9.7 K/uL (ref 4.0–10.5)
nRBC: 0 % (ref 0.0–0.2)

## 2023-12-16 LAB — COMPREHENSIVE METABOLIC PANEL WITH GFR
ALT: 12 U/L (ref 0–44)
AST: 18 U/L (ref 15–41)
Albumin: 3.5 g/dL (ref 3.5–5.0)
Alkaline Phosphatase: 123 U/L (ref 38–126)
Anion gap: 9 (ref 5–15)
BUN: 16 mg/dL (ref 8–23)
CO2: 28 mmol/L (ref 22–32)
Calcium: 9.6 mg/dL (ref 8.9–10.3)
Chloride: 102 mmol/L (ref 98–111)
Creatinine, Ser: 0.94 mg/dL (ref 0.44–1.00)
GFR, Estimated: >60 mL/min
Glucose, Bld: 82 mg/dL (ref 70–99)
Potassium: 3.5 mmol/L (ref 3.5–5.1)
Sodium: 139 mmol/L (ref 135–145)
Total Bilirubin: 0.5 mg/dL (ref 0.0–1.2)
Total Protein: 7.5 g/dL (ref 6.5–8.1)

## 2023-12-25 ENCOUNTER — Inpatient Hospital Stay (HOSPITAL_BASED_OUTPATIENT_CLINIC_OR_DEPARTMENT_OTHER): Payer: Medicare HMO | Admitting: Oncology

## 2023-12-25 DIAGNOSIS — D649 Anemia, unspecified: Secondary | ICD-10-CM | POA: Diagnosis not present

## 2023-12-25 DIAGNOSIS — Z170 Estrogen receptor positive status [ER+]: Secondary | ICD-10-CM

## 2023-12-25 DIAGNOSIS — Z9189 Other specified personal risk factors, not elsewhere classified: Secondary | ICD-10-CM | POA: Diagnosis not present

## 2023-12-25 DIAGNOSIS — C50511 Malignant neoplasm of lower-outer quadrant of right female breast: Secondary | ICD-10-CM | POA: Diagnosis not present

## 2023-12-25 NOTE — Progress Notes (Signed)
 Virtual Visit via Telephone Note  I connected with Jill Wheeler on 12/25/23 at  3:00 PM EDT by telephone and verified that I am speaking with the correct person using two identifiers.  Location: Patient: Home Provider: Clinic    I discussed the limitations, risks, security and privacy concerns of performing an evaluation and management service by telephone and the availability of in person appointments. I also discussed with the patient that there may be a patient responsible charge related to this service. The patient expressed understanding and agreed to proceed.   History of Present Illness: Jill Wheeler female who returns for routine follow-up for history of right-sided breast cancer.  She was last seen in clinic by me on 06/12/2023.  Since her last visit, she had a bone density scan on 12/16/2023 which showed a T-score of -0.2.  This is considered normal.  She had a mammogram on 12/16/2023 which was BI-RADS Category 1 negative.  She denies any hospitalizations, surgeries or changes to her baseline health.  Overall she is doing well.  Energy and appetite levels are 100%.  Has an occasional cough and some numbness and burning on her right side.  PCP thinks this may be related to some nerve damage.  Reports symptoms happen intermittently.  Appetite and energy levels are 100%.  She denies any pain.  Observations/Objective:Review of Systems  Respiratory:  Positive for cough.   Neurological:  Positive for tingling and sensory change.   Physical Exam Vitals reviewed.  Neurological:     Mental Status: She is alert and oriented to person, place, and time.    Assessment and Plan: 1. Malignant neoplasm of lower-outer quadrant of right breast of female, estrogen receptor positive (HCC) (Primary) -Diagnosed in December 2021 with invasive ductal carcinoma.  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  Status post right lumpectomy and sentinel lymph node biopsy on 12/12/2020. -She was  started on anastrozole in March 2022.  She is tolerating well. -She completed XRT to right breast in May 2022. -Most recent mammogram from 12/16/2023 was read as BI-RADS Category 1. -She denies any new lumps or bumps. -Labs from 12/16/2023 show a normal CMP with mild anemia (Hgb 11.0).  We discussed adding on nutritional labs at her next visit.  2. At risk for loss of bone density -On anastrozole.  -Bone density scan from 12/16/2023 showed a T-score of -0.02.  This is considered normal. -Continue calcium and vitamin D.   -Vitamin D level from 12/16/2023 was 35.04.  3.  Anemia: -Hemoglobin from 12/16/2023 was 11.0. -Based on chart review, it appears she has been intermittently anemic since 2022.  I do not see any other workup for this. -We discussed coming back next week for labs and I will call her with the results. -She denies any bleeding, melena or hematochezia.  She eats a variety of foods in her diet.  She eats meats.  Follow Up Instructions: Lab add-on in the next 1 to 2 weeks. Return to clinic virtually to discuss results.   I discussed the assessment and treatment plan with the patient. The patient was provided an opportunity to ask questions and all were answered. The patient agreed with the plan and demonstrated an understanding of the instructions.   The patient was advised to call back or seek an in-person evaluation if the symptoms worsen or if the condition fails to improve as anticipated.  I provided 20 minutes of non-face-to-face time during this encounter.   Mauro Kaufmann, NP

## 2024-01-12 ENCOUNTER — Inpatient Hospital Stay: Attending: Oncology

## 2024-01-12 DIAGNOSIS — Z79811 Long term (current) use of aromatase inhibitors: Secondary | ICD-10-CM | POA: Diagnosis not present

## 2024-01-12 DIAGNOSIS — D649 Anemia, unspecified: Secondary | ICD-10-CM | POA: Diagnosis not present

## 2024-01-12 DIAGNOSIS — Z170 Estrogen receptor positive status [ER+]: Secondary | ICD-10-CM | POA: Insufficient documentation

## 2024-01-12 DIAGNOSIS — C50511 Malignant neoplasm of lower-outer quadrant of right female breast: Secondary | ICD-10-CM | POA: Insufficient documentation

## 2024-01-12 LAB — IRON AND TIBC
Iron: 70 ug/dL (ref 28–170)
Saturation Ratios: 18 % (ref 10.4–31.8)
TIBC: 380 ug/dL (ref 250–450)
UIBC: 310 ug/dL

## 2024-01-12 LAB — FERRITIN: Ferritin: 19 ng/mL (ref 11–307)

## 2024-01-12 LAB — VITAMIN B12: Vitamin B-12: 1837 pg/mL — ABNORMAL HIGH (ref 180–914)

## 2024-01-12 LAB — FOLATE: Folate: 17.9 ng/mL (ref >5.9)

## 2024-01-15 ENCOUNTER — Inpatient Hospital Stay: Admitting: Oncology

## 2024-01-15 DIAGNOSIS — D649 Anemia, unspecified: Secondary | ICD-10-CM | POA: Diagnosis not present

## 2024-01-15 DIAGNOSIS — Z170 Estrogen receptor positive status [ER+]: Secondary | ICD-10-CM

## 2024-01-15 DIAGNOSIS — C50511 Malignant neoplasm of lower-outer quadrant of right female breast: Secondary | ICD-10-CM

## 2024-01-15 LAB — COPPER, SERUM: Copper: 144 ug/dL (ref 80–158)

## 2024-01-15 NOTE — Progress Notes (Signed)
 Virtual Visit via Telephone Note  I connected with Jill Wheeler on 01/15/24 at  2:00 PM EDT by telephone and verified that I am speaking with the correct person using two identifiers.  Location: Patient: Home Provider: Clinic    I discussed the limitations, risks, security and privacy concerns of performing an evaluation and management service by telephone and the availability of in person appointments. I also discussed with the patient that there may be a patient responsible charge related to this service. The patient expressed understanding and agreed to proceed.   History of Present Illness: Jill Wheeler female who returns for routine follow-up for history of right-sided breast cancer.  She was last seen in clinic by me on 12/25/23 for follow-up for breast cancer and was found to be slightly more anemic than usual.  She returned on 01/12/2024 for additional labs and she is here to review today.  Doing well since her last visit with [place].  Denies any bleeding.  Appetite is 100% energy levels are 75%.  Reports she was previously on oral iron tablets and ran out and forgot to buy more.  When she had her labs drawn initially, she had not been taking iron for about 2 to 3 weeks.  She has since restarted.  She tolerates them well.   Observations/Objective:Review of Systems  Neurological:  Positive for sensory change.   Physical Exam Vitals reviewed.  Neurological:     Mental Status: She is alert and oriented to person, place, and time.     Assessment and Plan: 1  Anemia: -Hemoglobin from 12/16/2023 was 11.0. -Based on chart review, it appears she has been intermittently anemic since 2022.   -Nutritional workup from 01/12/2024 shows iron saturation 18%, ferritin 19, folate 17.9, copper 144 with an elevated vitamin B12 level 1837. -Reports she has been on oral iron for several years but has been out for the past 2 to 3 weeks.  She tolerates them well. -Recommend she continue iron  supplements and try increasing to 2/day and take with vitamin C or orange juice to increase absorption. -She denies any bleeding, melena or hematochezia.  She eats a variety of foods in her diet.  She eats meats.  Follow Up Instructions: Increase iron supplements to 2/day and return to clinic in approximately 10 weeks for labs and virtual visit.   I discussed the assessment and treatment plan with the patient. The patient was provided an opportunity to ask questions and all were answered. The patient agreed with the plan and demonstrated an understanding of the instructions.   The patient was advised to call back or seek an in-person evaluation if the symptoms worsen or if the condition fails to improve as anticipated.  I provided 16 minutes of non-face-to-face time during this encounter.   Mauro Kaufmann, NP

## 2024-03-24 ENCOUNTER — Inpatient Hospital Stay: Attending: Oncology

## 2024-03-24 DIAGNOSIS — D649 Anemia, unspecified: Secondary | ICD-10-CM

## 2024-03-24 DIAGNOSIS — Z170 Estrogen receptor positive status [ER+]: Secondary | ICD-10-CM

## 2024-03-24 DIAGNOSIS — C50511 Malignant neoplasm of lower-outer quadrant of right female breast: Secondary | ICD-10-CM | POA: Insufficient documentation

## 2024-03-24 LAB — CBC WITH DIFFERENTIAL/PLATELET
Abs Immature Granulocytes: 0.03 10*3/uL (ref 0.00–0.07)
Basophils Absolute: 0 10*3/uL (ref 0.0–0.1)
Basophils Relative: 0 %
Eosinophils Absolute: 0.2 10*3/uL (ref 0.0–0.5)
Eosinophils Relative: 3 %
HCT: 35.6 % — ABNORMAL LOW (ref 36.0–46.0)
Hemoglobin: 11.2 g/dL — ABNORMAL LOW (ref 12.0–15.0)
Immature Granulocytes: 0 %
Lymphocytes Relative: 17 %
Lymphs Abs: 1.5 10*3/uL (ref 0.7–4.0)
MCH: 29.5 pg (ref 26.0–34.0)
MCHC: 31.5 g/dL (ref 30.0–36.0)
MCV: 93.7 fL (ref 80.0–100.0)
Monocytes Absolute: 0.5 10*3/uL (ref 0.1–1.0)
Monocytes Relative: 6 %
Neutro Abs: 6.8 10*3/uL (ref 1.7–7.7)
Neutrophils Relative %: 74 %
Platelets: 227 10*3/uL (ref 150–400)
RBC: 3.8 MIL/uL — ABNORMAL LOW (ref 3.87–5.11)
RDW: 14.3 % (ref 11.5–15.5)
WBC: 9.1 10*3/uL (ref 4.0–10.5)
nRBC: 0 % (ref 0.0–0.2)

## 2024-03-24 LAB — COMPREHENSIVE METABOLIC PANEL WITH GFR
ALT: 14 U/L (ref 0–44)
AST: 21 U/L (ref 15–41)
Albumin: 3.4 g/dL — ABNORMAL LOW (ref 3.5–5.0)
Alkaline Phosphatase: 134 U/L — ABNORMAL HIGH (ref 38–126)
Anion gap: 10 (ref 5–15)
BUN: 16 mg/dL (ref 8–23)
CO2: 28 mmol/L (ref 22–32)
Calcium: 9.4 mg/dL (ref 8.9–10.3)
Chloride: 104 mmol/L (ref 98–111)
Creatinine, Ser: 0.98 mg/dL (ref 0.44–1.00)
GFR, Estimated: >60 mL/min (ref >60)
Glucose, Bld: 112 mg/dL — ABNORMAL HIGH (ref 70–99)
Potassium: 4 mmol/L (ref 3.5–5.1)
Sodium: 142 mmol/L (ref 135–145)
Total Bilirubin: 0.7 mg/dL (ref 0.0–1.2)
Total Protein: 7.3 g/dL (ref 6.5–8.1)

## 2024-03-24 LAB — IRON AND TIBC
Iron: 68 ug/dL (ref 28–170)
Saturation Ratios: 17 % (ref 10.4–31.8)
TIBC: 398 ug/dL (ref 250–450)
UIBC: 330 ug/dL

## 2024-03-24 LAB — FERRITIN: Ferritin: 13 ng/mL (ref 11–307)

## 2024-03-24 LAB — VITAMIN B12: Vitamin B-12: 2823 pg/mL — ABNORMAL HIGH (ref 180–914)

## 2024-03-30 NOTE — Progress Notes (Signed)
 Virtual Visit via Telephone Note  I connected with Jill Wheeler on 03/31/24 at  8:30 AM EDT by telephone and verified that I am speaking with the correct person using two identifiers.  Location: Patient: Home Provider: Clinic    I discussed the limitations, risks, security and privacy concerns of performing an evaluation and management service by telephone and the availability of in person appointments. I also discussed with the patient that there may be a patient responsible charge related to this service. The patient expressed understanding and agreed to proceed.   History of Present Illness: Mrs. Jill Wheeler female who returns for routine follow-up for history of right-sided breast cancer.  She was last seen in clinic by me on 01/15/24 for follow-up for breast cancer and was found to be slightly more anemic than usual.  She continues iron tablets with vitamin C 2/day.  At her last visit, she had been out of iron for several weeks.  Reports since she was seen by us  last, she has started taking 2 iron tablets daily and has developed a full-body rash.  It is very itchy.  She was evaluated by her PCP yesterday and given a prescription for hydroxyzine.  She has not had this filled yet.  Reports a rash on her trunk, arms and legs.  Adding the additional iron was the only change she made.  Energy levels are great.  She denies any bleeding.  Appetite is 100%.  Reports she has seasonal allergies and they are not at the moment.  She is also on famotidine for acid reflux.  No recent hospitalizations or changes to her baseline health.   Observations/Objective:Review of Systems  Respiratory:  Positive for cough.   Skin:  Positive for itching and rash.   Physical Exam Vitals reviewed.   Neurological:     Mental Status: She is alert and oriented to person, place, and time.     Assessment and Plan: 1  Anemia: -Based on chart review, it appears she has been intermittently anemic since 2022.    -Nutritional workup from 01/12/2024 shows iron saturation 18%, ferritin 19, folate 17.9, copper  144 with an elevated vitamin B12 level 1837. -Repeat labs from 03/24/2024 shows hemoglobin 11.2 (11.0), ferritin 13 (19) and iron saturation 17% (18%).  TIBC is slightly more elevated than previous at 398. -She reports she is taking iron tablets 2/day with orange juice with good tolerance. -We discussed different iron options from oral iron given she is experiencing a rash.  She is not interested in IV iron at this time. -Discussed switching from ferrous sulfate to ferrous gluconate.  Recommend 2 tablets at least 6 hours from her Pepcid to increase absorption.  -She denies any bleeding, melena or hematochezia.  She eats a variety of foods in her diet.  She eats meats. -Recommend she return to clinic in 12 weeks with labs a few days before.  2.  B12 deficiency: -Labs from 03/24/2024 show a B12 level of 2823. -Recommend discontinuing her B12 supplement at this time.  Will recheck in 3 months.  3.  Breast cancer: -No new concerns at this time. -Next mammogram will be due in March 2026.  Follow Up Instructions: -Switch iron tablets from ferrous sulfate to ferrous gluconate to see if she is able to tolerate better given she has developed a full-body rash since increasing from 1 to 2 tablets. -She is welcome to start hydroxyzine as needed for her allergies and itching. -Recommend she stop vitamin B12 supplements as her levels are  high. -Repeat mammogram in March 2026. -Return to clinic in 3 months with labs a few days before.  I discussed the assessment and treatment plan with the patient. The patient was provided an opportunity to ask questions and all were answered. The patient agreed with the plan and demonstrated an understanding of the instructions.   The patient was advised to call back or seek an in-person evaluation if the symptoms worsen or if the condition fails to improve as anticipated.  I  provided 20 minutes of non-face-to-face time during this encounter.   Aurther Blue, NP

## 2024-03-31 ENCOUNTER — Inpatient Hospital Stay: Admitting: Oncology

## 2024-03-31 DIAGNOSIS — D649 Anemia, unspecified: Secondary | ICD-10-CM

## 2024-03-31 DIAGNOSIS — E538 Deficiency of other specified B group vitamins: Secondary | ICD-10-CM | POA: Diagnosis not present

## 2024-03-31 DIAGNOSIS — Z170 Estrogen receptor positive status [ER+]: Secondary | ICD-10-CM

## 2024-03-31 DIAGNOSIS — C50511 Malignant neoplasm of lower-outer quadrant of right female breast: Secondary | ICD-10-CM

## 2024-06-22 ENCOUNTER — Inpatient Hospital Stay: Attending: Oncology

## 2024-06-22 DIAGNOSIS — Z923 Personal history of irradiation: Secondary | ICD-10-CM | POA: Insufficient documentation

## 2024-06-22 DIAGNOSIS — C50511 Malignant neoplasm of lower-outer quadrant of right female breast: Secondary | ICD-10-CM | POA: Insufficient documentation

## 2024-06-22 DIAGNOSIS — E538 Deficiency of other specified B group vitamins: Secondary | ICD-10-CM | POA: Diagnosis not present

## 2024-06-22 DIAGNOSIS — Z79811 Long term (current) use of aromatase inhibitors: Secondary | ICD-10-CM | POA: Diagnosis not present

## 2024-06-22 DIAGNOSIS — Z170 Estrogen receptor positive status [ER+]: Secondary | ICD-10-CM | POA: Diagnosis not present

## 2024-06-22 DIAGNOSIS — D649 Anemia, unspecified: Secondary | ICD-10-CM

## 2024-06-22 DIAGNOSIS — D508 Other iron deficiency anemias: Secondary | ICD-10-CM | POA: Diagnosis not present

## 2024-06-22 LAB — VITAMIN D 25 HYDROXY (VIT D DEFICIENCY, FRACTURES): Vit D, 25-Hydroxy: 42.9 ng/mL (ref 30–100)

## 2024-06-22 LAB — COMPREHENSIVE METABOLIC PANEL WITH GFR
ALT: 13 U/L (ref 0–44)
AST: 19 U/L (ref 15–41)
Albumin: 3.3 g/dL — ABNORMAL LOW (ref 3.5–5.0)
Alkaline Phosphatase: 122 U/L (ref 38–126)
Anion gap: 12 (ref 5–15)
BUN: 14 mg/dL (ref 8–23)
CO2: 27 mmol/L (ref 22–32)
Calcium: 9.2 mg/dL (ref 8.9–10.3)
Chloride: 104 mmol/L (ref 98–111)
Creatinine, Ser: 1.04 mg/dL — ABNORMAL HIGH (ref 0.44–1.00)
GFR, Estimated: 58 mL/min — ABNORMAL LOW (ref >60)
Glucose, Bld: 124 mg/dL — ABNORMAL HIGH (ref 70–99)
Potassium: 3.8 mmol/L (ref 3.5–5.1)
Sodium: 143 mmol/L (ref 135–145)
Total Bilirubin: 0.7 mg/dL (ref 0.0–1.2)
Total Protein: 7.2 g/dL (ref 6.5–8.1)

## 2024-06-22 LAB — CBC WITH DIFFERENTIAL/PLATELET
Abs Immature Granulocytes: 0.03 K/uL (ref 0.00–0.07)
Basophils Absolute: 0.1 K/uL (ref 0.0–0.1)
Basophils Relative: 1 %
Eosinophils Absolute: 0.2 K/uL (ref 0.0–0.5)
Eosinophils Relative: 2 %
HCT: 36.7 % (ref 36.0–46.0)
Hemoglobin: 11.4 g/dL — ABNORMAL LOW (ref 12.0–15.0)
Immature Granulocytes: 0 %
Lymphocytes Relative: 15 %
Lymphs Abs: 1.4 K/uL (ref 0.7–4.0)
MCH: 29.5 pg (ref 26.0–34.0)
MCHC: 31.1 g/dL (ref 30.0–36.0)
MCV: 95.1 fL (ref 80.0–100.0)
Monocytes Absolute: 0.5 K/uL (ref 0.1–1.0)
Monocytes Relative: 6 %
Neutro Abs: 6.8 K/uL (ref 1.7–7.7)
Neutrophils Relative %: 76 %
Platelets: 229 K/uL (ref 150–400)
RBC: 3.86 MIL/uL — ABNORMAL LOW (ref 3.87–5.11)
RDW: 13.6 % (ref 11.5–15.5)
WBC: 8.9 K/uL (ref 4.0–10.5)
nRBC: 0 % (ref 0.0–0.2)

## 2024-06-22 LAB — VITAMIN B12: Vitamin B-12: 1780 pg/mL — ABNORMAL HIGH (ref 180–914)

## 2024-06-22 LAB — IRON AND TIBC
Iron: 45 ug/dL (ref 28–170)
Saturation Ratios: 13 % (ref 10.4–31.8)
TIBC: 347 ug/dL (ref 250–450)
UIBC: 302 ug/dL

## 2024-06-22 LAB — FERRITIN: Ferritin: 31 ng/mL (ref 11–307)

## 2024-06-29 ENCOUNTER — Inpatient Hospital Stay: Admitting: Oncology

## 2024-06-29 VITALS — BP 120/55 | HR 92 | Temp 98.0°F | Resp 20 | Wt 189.2 lb

## 2024-06-29 DIAGNOSIS — D509 Iron deficiency anemia, unspecified: Secondary | ICD-10-CM | POA: Insufficient documentation

## 2024-06-29 DIAGNOSIS — D508 Other iron deficiency anemias: Secondary | ICD-10-CM | POA: Diagnosis not present

## 2024-06-29 DIAGNOSIS — C50511 Malignant neoplasm of lower-outer quadrant of right female breast: Secondary | ICD-10-CM | POA: Diagnosis not present

## 2024-06-29 DIAGNOSIS — E538 Deficiency of other specified B group vitamins: Secondary | ICD-10-CM | POA: Insufficient documentation

## 2024-06-29 DIAGNOSIS — Z170 Estrogen receptor positive status [ER+]: Secondary | ICD-10-CM | POA: Diagnosis not present

## 2024-06-29 NOTE — Assessment & Plan Note (Addendum)
-  Based on chart review, it appears she has been intermittently anemic since 2022.   -Nutritional workup from 01/12/2024 shows iron saturation 18%, ferritin 19, folate 17.9, copper  144 with an elevated vitamin B12 level 1837. -Repeat labs from 06/22/2024 show hemoglobin of 11.4 (11.2), ferritin 31 (13).  Iron saturations are slightly lower than before 13% (17%).  She is tolerating iron tablets daily without any issue.  She would like to avoid IV iron if possible. -She denies any bleeding, melena or hematochezia.  She eats a variety of foods in her diet.  She eats meats. -Recommend she return to clinic in 12 weeks with labs a few days before.

## 2024-06-29 NOTE — Assessment & Plan Note (Addendum)
-  Labs from 03/24/2024 show a B12 level of 1780 (2823). - Recommend B12 every other day.

## 2024-06-29 NOTE — Assessment & Plan Note (Addendum)
-  No new concerns at this time. -Next mammogram will be due in March 2026. -Orders placed. -She also recently just had a bone density scan which showed T-score of -0.2.  Continue calcium and vitamin D .  Repeat bone density every 2 years. -She is currently on anastrozole  and tolerating well except for mild hair thinning. -We discussed taking OTC hair skin and nails along with collagen to see if that would be helpful. -We also discussed switching to different aromatase inhibitor but otherwise she is tolerating it well.  She will complete 5 years of treatment in March 2027.  We will plan to complete [facility]CI's testing a few months prior to her 5 years.

## 2024-06-29 NOTE — Progress Notes (Signed)
 [facility] OFFICE PROGRESS NOTE  Pcp, No  ASSESSMENT & PLAN:    Assessment & Plan Other iron deficiency anemia -Based on chart review, it appears she has been intermittently anemic since 2022.   -Nutritional workup from 01/12/2024 shows iron saturation 18%, ferritin 19, folate 17.9, copper  144 with an elevated vitamin B12 level 1837. -Repeat labs from 06/22/2024 show hemoglobin of 11.4 (11.2), ferritin 31 (13).  Iron saturations are slightly lower than before 13% (17%).  She is tolerating iron tablets daily without any issue.  She would like to avoid IV iron if possible. -She denies any bleeding, melena or hematochezia.  She eats a variety of foods in her diet.  She eats meats. -Recommend she return to clinic in 12 weeks with labs a few days before. Vitamin B12 deficiency disease -Labs from 03/24/2024 show a B12 level of 1780 (2823). - Recommend B12 every other day. Malignant neoplasm of lower-outer quadrant of right breast of female, estrogen receptor positive (HCC) -No new concerns at this time. -Next mammogram will be due in March 2026. -Orders placed. -She also recently just had a bone density scan which showed T-score of -0.2.  Continue calcium and vitamin D .  Repeat bone density every 2 years. -She is currently on anastrozole  and tolerating well except for mild hair thinning. -We discussed taking OTC hair skin and nails along with collagen to see if that would be helpful. -We also discussed switching to different aromatase inhibitor but otherwise she is tolerating it well.  She will complete 5 years of treatment in March 2027.  We will plan to complete BCI's testing a few months prior to her 5 years.  Orders Placed This Encounter  Procedures   MM 3D SCREENING MAMMOGRAM BILATERAL BREAST    Per office: No implants NAS No devices    Standing Status:   Future    Expected Date:   11/29/2024    Expiration Date:   06/29/2025    Reason for Exam (SYMPTOM  OR DIAGNOSIS  REQUIRED):   annual    Preferred imaging location?:   [facility]   Comprehensive metabolic panel    Standing Status:   Future    Expected Date:   12/27/2024    Expiration Date:   03/27/2025    Release to patient:   Immediate   CBC with Differential/Platelet    Standing Status:   Future    Expected Date:   12/27/2024    Expiration Date:   03/27/2025    Release to patient:   Immediate   Vitamin B12    Standing Status:   Future    Expected Date:   12/27/2024    Expiration Date:   03/27/2025   Iron and TIBC ([facility] ONLY)    Standing Status:   Future    Expected Date:   12/27/2024    Expiration Date:   03/27/2025   Ferritin    Standing Status:   Future    Expected Date:   12/27/2024    Expiration Date:   03/27/2025   VITAMIN D  25 Hydroxy (Vit-D Deficiency, Fractures)    Standing Status:   Future    Expected Date:   12/27/2024    Expiration Date:   03/27/2025    Release to patient:   Immediate    INTERVAL HISTORY: Patient returns for follow-up.  Reports she has done well since her last visit.  Appetite and energy levels are 100%.  She denies any pain.  Any new lumps or bumps.  Reports she is tolerating iron tablets 1/day well without constipation or abdominal upset.  Reports she has noticed thinning of her hair over the past few months.  States she knew it was becoming thinner but here recently it is far more noticeable.  She is wondering if it has to be related to the anastrozole .  She denies any joint pain or hot flashes.  We reviewed CBC, CMP, vitamin B12, iron panel, ferritin and vitamin D  level.  SUMMARY OF HEMATOLOGIC HISTORY: Oncology History  Breast cancer of lower-outer quadrant of right female breast (HCC)  01/03/2021 Initial Diagnosis   Breast cancer of lower-outer quadrant of right female breast (HCC)   01/07/2021 Cancer Staging   Staging form: Breast, AJCC 8th Edition - Clinical stage from 01/07/2021: Stage IA (cT1a, cN0, cM0, G2, ER+, PR+, HER2+) -  Signed by Rogers Hai, MD on 01/07/2021 Stage prefix: Initial diagnosis Nuclear grade: G2 Histologic grading system: 3 grade system Ki-67 (%): 10    1.  Stage Ia (T1 a N0) right breast IDC, ER/PR/HER-2 positive: - Screening mammogram on 09/18/2020, BI-RADS Category 0. - Right breast ultrasound on 10/03/2020-0.4 cm mass at right breast 8 o'clock position - Biopsy of right breast mass on 10/17/2020. 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 - Right lumpectomy and SLNB on 12/12/2020 by Dr. Maranda in [place]. - Pathology consistent with grade two 0.5 cm invasive ductal carcinoma, 0.8 cm to the closest anterior resection margin.  DCIS, high-grade with comedonecrosis, at least 3.2 cm, lesion comes to less than 0.1 cm of closest posterior resection margin.  0/2 sentinel lymph nodes positive for metastatic carcinoma.  ER-60%, PR-90%, Ki-67-10%, HER-2 positive by FISH.  PT1aPN0. - Anastrozole  started in March 2022.  She is tolerating anastrozole  reasonably well.   - Completed XRT to the right breast in May 2022  CBC    Component Value Date/Time   WBC 8.9 06/22/2024 1049   RBC 3.86 (L) 06/22/2024 1049   HGB 11.4 (L) 06/22/2024 1049   HCT 36.7 06/22/2024 1049   PLT 229 06/22/2024 1049   MCV 95.1 06/22/2024 1049   MCH 29.5 06/22/2024 1049   MCHC 31.1 06/22/2024 1049   RDW 13.6 06/22/2024 1049   LYMPHSABS 1.4 06/22/2024 1049   MONOABS 0.5 06/22/2024 1049   EOSABS 0.2 06/22/2024 1049   BASOSABS 0.1 06/22/2024 1049       Latest Ref Rng & Units 06/22/2024   10:49 AM 03/24/2024    9:52 AM 12/16/2023   11:24 AM  CMP  Glucose 70 - 99 mg/dL 875  887  82   BUN 8 - 23 mg/dL 14  16  16    Creatinine 0.44 - 1.00 mg/dL 8.95  9.01  9.05   Sodium 135 - 145 mmol/L 143  142  139   Potassium 3.5 - 5.1 mmol/L 3.8  4.0  3.5   Chloride 98 - 111 mmol/L 104  104  102   CO2 22 - 32 mmol/L  27  28  28    Calcium 8.9 - 10.3 mg/dL 9.2  9.4  9.6   Total Protein 6.5 - 8.1 g/dL 7.2  7.3  7.5   Total Bilirubin 0.0 - 1.2 mg/dL 0.7  0.7  0.5   Alkaline Phos 38 - 126 U/L 122  134  123   AST 15 - 41  U/L 19  21  18    ALT 0 - 44 U/L 13  14  12       Lab Results  Component Value Date   FERRITIN 31 06/22/2024   VITAMINB12 1,780 (H) 06/22/2024    Vitals:   06/29/24 1051 06/29/24 1056  BP: (!) 141/64 (!) 120/55  Pulse: 92   Resp: 20   Temp: 98 F (36.7 C)   SpO2: 99%     Review of System:  Review of Systems  All other systems reviewed and are negative.   Physical Exam: Physical Exam Constitutional:      Appearance: Normal appearance.  HENT:     Head: Normocephalic and atraumatic.  Eyes:     Pupils: Pupils are equal, round, and reactive to light.  Cardiovascular:     Rate and Rhythm: Normal rate and regular rhythm.     Heart sounds: Normal heart sounds. No murmur heard. Pulmonary:     Effort: Pulmonary effort is normal.     Breath sounds: Normal breath sounds. No wheezing.  Abdominal:     General: Bowel sounds are normal. There is no distension.     Palpations: Abdomen is soft.     Tenderness: There is no abdominal tenderness.  Musculoskeletal:        General: Normal range of motion.     Cervical back: Normal range of motion.  Skin:    General: Skin is warm and dry.     Findings: No rash.  Neurological:     Mental Status: She is alert and oriented to person, place, and time.     Gait: Gait is intact.  Psychiatric:        Mood and Affect: Mood and affect normal.        Cognition and Memory: Memory normal.        Judgment: Judgment normal.      I spent 25 minutes dedicated to the care of this patient (face-to-face and non-face-to-face) on the date of the encounter to include what is described in the assessment and plan.,  Delon Hope, NP 06/29/2024 12:08 PM

## 2024-06-29 NOTE — Patient Instructions (Addendum)
 Can try hair, skin and nail and Collagen vitamins.   Continue iron tabs daily with vit C.   Decrease B12 to everyother day.   Take as prescribed.

## 2024-07-11 ENCOUNTER — Other Ambulatory Visit: Payer: Self-pay | Admitting: *Deleted

## 2024-07-11 DIAGNOSIS — Z170 Estrogen receptor positive status [ER+]: Secondary | ICD-10-CM

## 2024-07-11 MED ORDER — ANASTROZOLE 1 MG PO TABS: 1.0000 mg | ORAL_TABLET | Freq: Every day | ORAL | 2 refills | Status: AC

## 2024-12-19 ENCOUNTER — Ambulatory Visit (HOSPITAL_COMMUNITY)

## 2024-12-19 ENCOUNTER — Inpatient Hospital Stay

## 2024-12-27 ENCOUNTER — Ambulatory Visit: Admitting: Oncology

## 2024-12-27 ENCOUNTER — Inpatient Hospital Stay: Admitting: Oncology
